# Patient Record
Sex: Female | Born: 1985 | Race: White | Hispanic: Yes | Marital: Single | Smoking: Never smoker
Health system: Southern US, Community
[De-identification: ages and names within clinical notes are randomized; demographics above are authoritative.]

## PROBLEM LIST (undated history)

## (undated) DIAGNOSIS — F419 Anxiety disorder, unspecified: Secondary | ICD-10-CM

## (undated) DIAGNOSIS — Z309 Encounter for contraceptive management, unspecified: Secondary | ICD-10-CM

## (undated) DIAGNOSIS — Z8619 Personal history of other infectious and parasitic diseases: Secondary | ICD-10-CM

## (undated) HISTORY — DX: Encounter for contraceptive management, unspecified: Z30.9

## (undated) HISTORY — PX: OTHER SURGICAL HISTORY: SHX169

## (undated) HISTORY — DX: Personal history of other infectious and parasitic diseases: Z86.19

## (undated) HISTORY — DX: Anxiety disorder, unspecified: F41.9

---

## 2006-12-28 ENCOUNTER — Other Ambulatory Visit: Admission: RE | Admit: 2006-12-28 | Discharge: 2006-12-28 | Payer: Self-pay | Admitting: Obstetrics and Gynecology

## 2007-12-09 ENCOUNTER — Ambulatory Visit: Payer: Self-pay | Admitting: Gastroenterology

## 2007-12-23 ENCOUNTER — Ambulatory Visit (HOSPITAL_COMMUNITY): Admission: RE | Admit: 2007-12-23 | Discharge: 2007-12-23 | Payer: Self-pay | Admitting: Gastroenterology

## 2007-12-23 ENCOUNTER — Encounter: Payer: Self-pay | Admitting: Gastroenterology

## 2007-12-23 ENCOUNTER — Ambulatory Visit: Payer: Self-pay | Admitting: Gastroenterology

## 2008-01-03 ENCOUNTER — Other Ambulatory Visit: Admission: RE | Admit: 2008-01-03 | Discharge: 2008-01-03 | Payer: Self-pay | Admitting: Obstetrics & Gynecology

## 2009-02-13 ENCOUNTER — Other Ambulatory Visit: Admission: RE | Admit: 2009-02-13 | Discharge: 2009-02-13 | Payer: Self-pay | Admitting: Obstetrics and Gynecology

## 2010-08-20 NOTE — Op Note (Signed)
NAME:  Debbie Fernandez, Debbie Fernandez             ACCOUNT NO.:  [id number]   MEDICAL RECORD NO.:  [id number]          PATIENT TYPE:  AMB   LOCATION:  DAY                           FACILITY:  [facility]   PHYSICIAN:  Kassie Mends, M.D.      DATE OF BIRTH:  October 20, 1985   DATE OF PROCEDURE:  12/23/2007  DATE OF DISCHARGE:                               OPERATIVE REPORT   PROCEDURE:  Ileocolonoscopy with random cold forceps biopsies   INDICATION FOR EXAM:  Ms. Difonzo is a 25 year old female who  complains of abdominal pain, diarrhea, and rectal bleeding. Sometime,  she does have normal stool.  She takes ibuprofen.  She is having  financial difficulties.  Her BMI is consistent with being overweight  (29.1).   FINDINGS:  1. Normal terminal ileum, approximately 20 cm visualized.  2. Single ascending colon diverticulum.  Otherwise, no polyps, masses,      inflammatory changes, or AVMs seen.  Random biopsies obtained, via      cold forceps to evaluate for microscopic colitis.  3. Small internal hemorrhoids.  Otherwise, normal retroflexed view of      the rectum.   DIAGNOSES:  1. Single ascending colon diverticulum.  2. Small internal hemorrhoids.  3. 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   RECOMMENDATIONS:  1. She should follow high-fiber diet.  2. Will call her with results of her biopsies.  3. No aspirin, NSAIDs, or anticoagulation for 7 days.  4. Follow up in 4 weeks with Lorenza Burton regarding the abdominal      pain and diarrhea.   MEDICATIONS:  1. Demerol 75 mg IV.  2. Versed 4 mg IV.   PROCEDURE TECHNIQUE:  Physical exam was performed and informed consent  was obtained from the patient after explaining the benefits, risks, and  alternatives of the procedure.  The patient was connected to the monitor  and placed in the left lateral position.  Continuous oxygen was provided  by nasal cannula and IV medicine was administered through an  indwelling  cannula.  After administration of sedation and rectal exam, the  patient's rectum was intubated and scope was advanced under direct  visualization to the distal terminal ileum.  The scope was  removed slowly by carefully examining the color, texture, anatomy, and  integrity of mucosa on the way out. The patient was recovered in  endoscopy and discharged to home in satisfactory condition.   PATH:  Nl colon      Kassie Mends, M.D.  Electronically Signed     SM/MEDQ  D:  12/23/2007  T:  12/23/2007  Job:  [id number]

## 2010-08-20 NOTE — H&P (Signed)
NAME:  Debbie Fernandez, Debbie Fernandez             ACCOUNT NO.:  [id number]   MEDICAL RECORD NO.:  [id number]         PATIENT TYPE:  PAMB   LOCATION:  DAY                           FACILITY:  [facility]   PHYSICIAN:  Kassie Mends, M.D.      DATE OF BIRTH:  Oct 04, 1985   DATE OF ADMISSION:  DATE OF DISCHARGE:  LH                              HISTORY & PHYSICAL   CHIEF COMPLAINT:  Diarrhea/hematochezia.   HISTORY OF PRESENT ILLNESS:  Ms. Skeet is a 25 year old Hispanic  female.  She tells me that every time she eats she has significant  urgency to defecate.  She has had this problem for a couple of years  now.  She has loose stools twice per day.  Not long ago, she had a  similar episode, she ran to the bathroom, she noticed a large amount of  bright red blood in the toilet.  She tells me it looked like when she  had a period.  She complained of bilateral lower quadrant pain that is  intermittent.  Occasionally she gets constipated, but does have episodes  of diarrhea at least 2-3 times per week.  Her symptoms awaken her from  sleeping as well.  She has had 1 episode of nausea and vomiting with the  diarrhea.  She has tried antacids, which did not seem to help much.  She  does complain of heartburn and indigestion a couple of times about 3  times per week.  She does take an occasional NSAID about 600 mg of  ibuprofen once or twice a month for headaches.  She denies any fever or  chills.  Denies any anorexia.  She has lost about 5 pounds in the last  month or so.   CURRENT MEDICATIONS:  Denies any.   ALLERGIES:  No known drug allergies.   FAMILY HISTORY:  There is no known family history of colorectal  carcinoma, liver or chronic GI problems.  She has 14 siblings all of  whom are relatively healthy.  Her father is aged 41, alive and healthy.  Mother aged 55 has diabetes mellitus and cervical carcinoma.   SOCIAL HISTORY:  Ms. Pierre is single.  She lives alone.  She is  employed with [employer] full time.  She denies any tobacco or drug  use.  She consumes about 6 beers every 2 or 3 months.   REVIEW OF SYSTEMS:  See HPI.   GYN:  She is not currently sexually active and has not been in the last  9 months or so.  Her last menstrual period began December 06, 2007.  She  generally has 28 days cycles, which lasts about 3-5 days.  She denies  any menorrhagia.   PHYSICAL EXAMINATION:  VITAL SIGNS:  Weight 154 pounds, height 61  inches, temperature 99.3, blood pressure 120/80, and pulse 80.  GENERAL:  She is a well-developed, well-nourished female in no acute  distress.HEENT:  Sclerae clear, nonicteric.  Conjunctivae pink.  Oropharynx pink and moist without any lesions.NECK:  Supple without mass  or thyromegaly.CHEST:  Heart, regular rate and rhythm.  Normal S1 and  s2  without murmurs, clicks, rubs, or gallops.LUNGS:  Clear to auscultation  bilaterally.ABDOMEN:  Positive bowel sounds x4.  No bruits auscultated.  Soft, nontender, and nondistended without palpable mass or  hepatosplenomegaly.  No rebound tenderness or guarding.EXTREMITIES:  Without clubbing or edema.RECTAL:  No external lesions visualized.  Good  sphincter tone.  No internal masses palpated.  Small amount of Hemoccult  negative light brown stool was obtained from the vault.   IMPRESSION:  Ms. Ghazarian is a 25 year old female with chronic  intermittent diarrhea and severe urgency.  More concerning is her recent  episode of large volume hematochezia, which she describes is like a  period.  There is nothing to explain her bleeding on digital rectal  exam.  I do feel we need to rule out inflammatory bowel disease in Ms.  Ardyth Harps.  It is possible this could be irritable bowel syndrome with  benign anorectal source such as hemorrhoids or fissure.  Diverticula  bleeding should remain in the differential as well.  She does have  heartburn and indigestion about 3 times per week and may benefit from  PPI as well.    PLAN:  1. Will check CBC and sed rate.  2. Begin omeprazole 20 mg daily, #31 with 2 refills.  3. Levsin 0.125 mg a.c. and h.s. up to q.i.d. p.r.n. diarrhea, #60      with 1 refill.  4. Colonoscopy with Dr. Cira Servant in the near future.  Discussed the      procedure including risks and benefits which include but ,      tenderness, benefits, which include limited to bleeding infection,      perforation, and drug reaction.  She agrees.  Planned consent will      be obtained.      Lorenza Burton, N.P.      Kassie Mends, M.D.  Electronically Signed    KJ/MEDQ  D:  12/09/2007  T:  12/10/2007  Job:  [id number]

## 2012-03-22 ENCOUNTER — Other Ambulatory Visit (HOSPITAL_COMMUNITY)
Admission: RE | Admit: 2012-03-22 | Discharge: 2012-03-22 | Disposition: A | Discharge status: Home / Self Care | Payer: BC Managed Care – PPO | Source: Ambulatory Visit | Attending: Obstetrics and Gynecology | Admitting: Obstetrics and Gynecology

## 2012-03-22 ENCOUNTER — Other Ambulatory Visit: Payer: Self-pay | Admitting: Adult Health

## 2012-03-22 DIAGNOSIS — Z113 Encounter for screening for infections with a predominantly sexual mode of transmission: Secondary | ICD-10-CM | POA: Insufficient documentation

## 2012-03-22 DIAGNOSIS — Z01419 Encounter for gynecological examination (general) (routine) without abnormal findings: Secondary | ICD-10-CM | POA: Insufficient documentation

## 2013-04-11 ENCOUNTER — Other Ambulatory Visit: Payer: Self-pay | Admitting: Adult Health

## 2013-05-04 ENCOUNTER — Encounter (INDEPENDENT_AMBULATORY_CARE_PROVIDER_SITE_OTHER): Payer: Self-pay

## 2013-05-04 ENCOUNTER — Encounter: Payer: Self-pay | Admitting: Adult Health

## 2013-05-04 ENCOUNTER — Ambulatory Visit (INDEPENDENT_AMBULATORY_CARE_PROVIDER_SITE_OTHER): Payer: 59 | Admitting: Adult Health

## 2013-05-04 VITALS — BP 120/84 | HR 76 | Ht 61.5 in | Wt 174.5 lb

## 2013-05-04 DIAGNOSIS — Z01419 Encounter for gynecological examination (general) (routine) without abnormal findings: Secondary | ICD-10-CM

## 2013-05-04 DIAGNOSIS — Z309 Encounter for contraceptive management, unspecified: Secondary | ICD-10-CM | POA: Insufficient documentation

## 2013-05-04 HISTORY — DX: Encounter for contraceptive management, unspecified: Z30.9

## 2013-05-04 NOTE — Progress Notes (Signed)
Patient ID: Debbie Fernandez, female   DOB: 09-25-1985, 28 y.o.   MRN: [id number] History of Present Illness: Debbie Fernandez is a 28 year old Hispanic female in for a physical, she had a normal pap 03/2012.   Current Medications, Allergies, Past Medical History, Past Surgical History, Family History and Social History were reviewed in [facility] electronic medical record.     Review of Systems: Patient denies any headaches, blurred vision, shortness of breath, chest pain, abdominal pain, problems with bowel movements, or urination.She does complain of weight gain,tired at times and sex burns and is uncomfortable.No joint pain or mood swings. She says she notices vaginal odor at times.    Physical Exam:BP 120/84  Pulse 76  Ht 5' 1.5" (1.562 m)  Wt 174 lb 8 oz (79.153 kg)  BMI 32.44 kg/m2  LMP 04/08/2013 General:  Well developed, well nourished, no acute distress Skin:  Warm and dry Neck:  Midline trachea, normal thyroid Lungs; Clear to auscultation bilaterally Breast:  No dominant palpable mass, retraction, or nipple discharge Cardiovascular: Regular rate and rhythm Abdomen:  Soft, non tender, no hepatosplenomegaly Pelvic:  External genitalia is normal in appearance.  The vagina is normal in appearance, no discharge or odor. The cervix is smooth.  Uterus is felt to be normal size, shape, and contour.  No   adnexal masses or tenderness noted. Extremities:  No swelling or varicosities noted Psych:  No mood changes, alert and cooperative, seems happy Discussed using lubrication and increased fore play with sex.   Impression: Yearly gyn exam no pap contraceptive management    Plan: Physical and pap in 1 year Continue tri sprintec Return in 1 week for fasting labs,CBC,CMP,TSH and lipids

## 2013-05-04 NOTE — Patient Instructions (Signed)
Try lubricate and increase foreplay with sex Return in 1 year for pap

## 2013-05-05 ENCOUNTER — Telehealth: Payer: Self-pay | Admitting: Adult Health

## 2013-05-05 MED ORDER — NORETHIN ACE-ETH ESTRAD-FE 1-20 MG-MCG PO TABS: 1.0000 | ORAL_TABLET | Freq: Every day | ORAL | Status: DC

## 2013-05-05 NOTE — Telephone Encounter (Signed)
Pt wants a OC that is free will rx junel fe

## 2013-05-11 ENCOUNTER — Other Ambulatory Visit: Payer: 59

## 2013-09-14 ENCOUNTER — Telehealth: Payer: Self-pay | Admitting: *Deleted

## 2013-09-14 NOTE — Telephone Encounter (Signed)
Has various complaints, make appt

## 2013-09-14 NOTE — Telephone Encounter (Signed)
Pt states that she has been spotting and the last two days have been really bad. PT states that Monday her legs were achy then it moved in to her back and her whole body ached and now is in the back of her neck and head. Pt states that she is filling up the pantiliners, put one on at 5 this morning and was full by 10. In April she had an episode where she bled through her clothes at work and that has never happened. May she had spotting in between her period, and then this month she has had the spotting for a few days with the last 2 days being the worst. Pt also states that she has been having cramping as well. Pt states that she has been really depressed and stressed and doesn't know if that has anything to do with it. Pt is on BCP and was wanting to know if it could have anything to do with these issues.

## 2013-09-21 ENCOUNTER — Ambulatory Visit: Payer: 59 | Admitting: Adult Health

## 2013-12-28 ENCOUNTER — Ambulatory Visit (INDEPENDENT_AMBULATORY_CARE_PROVIDER_SITE_OTHER): Payer: 59 | Admitting: Adult Health

## 2013-12-28 ENCOUNTER — Encounter: Payer: Self-pay | Admitting: Adult Health

## 2013-12-28 DIAGNOSIS — Z3201 Encounter for pregnancy test, result positive: Secondary | ICD-10-CM

## 2013-12-28 LAB — POCT URINE PREGNANCY: Preg Test, Ur: POSITIVE

## 2013-12-28 NOTE — Progress Notes (Signed)
Pt here for pregnancy test. Positive result. Pt reports cramping, and breast tenderness, but no spotting. Advised cramping and spotting can be normal in early pregnancy, that's just everything getting settled into place. Advised when she notices either, push fluids, take it easy, and call office. Pt voiced understanding. JSY

## 2014-01-04 ENCOUNTER — Telehealth: Payer: Self-pay | Admitting: Adult Health

## 2014-01-04 NOTE — Telephone Encounter (Signed)
Spoke with pt. Pt wants to start birth control. Advised would need an appt to discuss options. Pt voiced understanding. Call transferred to front desk for appt. JSY

## 2014-01-10 ENCOUNTER — Ambulatory Visit: Payer: 59 | Admitting: Adult Health

## 2014-01-10 ENCOUNTER — Telehealth: Payer: Self-pay | Admitting: Adult Health

## 2014-01-10 NOTE — Telephone Encounter (Signed)
Has appt 10/19 will discuss birth control then, Took abortion pill last Saturday bleedig

## 2014-01-16 ENCOUNTER — Other Ambulatory Visit: Payer: 59

## 2014-01-23 ENCOUNTER — Encounter: Payer: Self-pay | Admitting: Adult Health

## 2014-01-23 ENCOUNTER — Ambulatory Visit (INDEPENDENT_AMBULATORY_CARE_PROVIDER_SITE_OTHER): Payer: 59 | Admitting: Adult Health

## 2014-01-23 VITALS — BP 100/60 | Ht 60.0 in | Wt 152.5 lb

## 2014-01-23 DIAGNOSIS — Z3202 Encounter for pregnancy test, result negative: Secondary | ICD-10-CM

## 2014-01-23 DIAGNOSIS — Z30018 Encounter for initial prescription of other contraceptives: Secondary | ICD-10-CM

## 2014-01-23 DIAGNOSIS — Z113 Encounter for screening for infections with a predominantly sexual mode of transmission: Secondary | ICD-10-CM

## 2014-01-23 LAB — POCT URINE PREGNANCY: Preg Test, Ur: NEGATIVE

## 2014-01-23 MED ORDER — ETONOGESTREL-ETHINYL ESTRADIOL 0.12-0.015 MG/24HR VA RING: VAGINAL_RING | VAGINAL | Status: DC

## 2014-01-23 NOTE — Progress Notes (Signed)
Subjective:     Patient ID: Debbie Fernandez, female   DOB: 03-Sep-1985, 28 y.o.   MRN: [id number]  HPI Debbie Fernandez is a 28 year old Hispanic female in to discuss birth control and get STD testing.  Review of Systems See HPI Reviewed past medical,surgical, social and family history. Reviewed medications and allergies.     Objective:   Physical Exam BP 100/60  Ht 5' (1.524 m)  Wt 152 lb 8 oz (69.174 kg)  BMI 29.78 kg/m2  LMP 09/28/2015UPT negative, had elective Ab by pill in September, no period since has had follow up there, wants birth control and not the pill had weight gain, will try the nuva ring, after discussing different kinds and side effects.    Assessment:     Contraceptive management STD screening    Plan:    Use condoms at least for 1 month GC/CHL sent Rx nuva ring x 1 year, 1 sample given today Review handout on nuva ring Return in 3 months for pap and physical

## 2014-01-23 NOTE — Patient Instructions (Signed)

## 2014-01-24 LAB — GC/CHLAMYDIA PROBE AMP
CT Probe RNA: NEGATIVE
GC Probe RNA: NEGATIVE

## 2014-01-27 ENCOUNTER — Ambulatory Visit: Payer: 59 | Admitting: Adult Health

## 2014-02-06 ENCOUNTER — Encounter: Payer: Self-pay | Admitting: Adult Health

## 2014-05-08 ENCOUNTER — Other Ambulatory Visit: Payer: 59 | Admitting: Adult Health

## 2014-05-08 ENCOUNTER — Other Ambulatory Visit: Payer: Self-pay | Admitting: Adult Health

## 2014-05-23 ENCOUNTER — Other Ambulatory Visit: Payer: Self-pay | Admitting: Adult Health

## 2014-05-23 ENCOUNTER — Other Ambulatory Visit: Payer: Self-pay | Admitting: *Deleted

## 2014-05-23 MED ORDER — ETONOGESTREL-ETHINYL ESTRADIOL 0.12-0.015 MG/24HR VA RING: VAGINAL_RING | VAGINAL | Status: DC

## 2014-05-24 ENCOUNTER — Other Ambulatory Visit (HOSPITAL_COMMUNITY)
Admission: RE | Admit: 2014-05-24 | Discharge: 2014-05-24 | Disposition: A | Discharge status: Home / Self Care | Payer: BLUE CROSS/BLUE SHIELD | Source: Ambulatory Visit | Attending: Obstetrics and Gynecology | Admitting: Obstetrics and Gynecology

## 2014-05-24 ENCOUNTER — Encounter: Payer: Self-pay | Admitting: Adult Health

## 2014-05-24 ENCOUNTER — Ambulatory Visit (INDEPENDENT_AMBULATORY_CARE_PROVIDER_SITE_OTHER): Payer: BLUE CROSS/BLUE SHIELD | Admitting: Adult Health

## 2014-05-24 VITALS — BP 110/70 | HR 78 | Ht 60.5 in | Wt 150.0 lb

## 2014-05-24 DIAGNOSIS — Z3040 Encounter for surveillance of contraceptives, unspecified: Secondary | ICD-10-CM

## 2014-05-24 DIAGNOSIS — Z01419 Encounter for gynecological examination (general) (routine) without abnormal findings: Secondary | ICD-10-CM | POA: Diagnosis not present

## 2014-05-24 MED ORDER — ETONOGESTREL-ETHINYL ESTRADIOL 0.12-0.015 MG/24HR VA RING: VAGINAL_RING | VAGINAL | Status: DC

## 2014-05-24 NOTE — Progress Notes (Signed)
Patient ID: Debbie Fernandez, female   DOB: 8/1Johney Frame1/1987, 29 y.o.   MRN: [id number] History of Present Illness: Debbie Fernandez is a 29 year old Hispanic female in for well woman gyn exam and pap.She is complaining of not starting period til puts ring in and it lasts 4 days, has been leaving in 25 days and out for 3 days.Has had some acne but better since using cleanser.She finishing up grad school.   Current Medications, Allergies, Past Medical History, Past Surgical History, Family History and Social History were reviewed in [facility] electronic medical record.     Review of Systems: Patient denies any headaches, hearing loss, fatigue, blurred vision, shortness of breath, chest pain, abdominal pain, problems with bowel movements, urination, or intercourse. No joint pain or mood swings, may be moody at times, but not enough for meds.See HPI for positives.    Physical Exam:BP 110/70 mmHg  Pulse 78  Ht 5' 0.5" (1.537 m)  Wt 150 lb (68.04 kg)  BMI 28.80 kg/m2  LMP 05/03/2014 General:  Well developed, well nourished, no acute distress Skin:  Warm and dry Neck:  Midline trachea, normal thyroid, good ROM, no lymphadenopathy Lungs; Clear to auscultation bilaterally Breast:  No dominant palpable mass, retraction, or nipple discharge Cardiovascular: Regular rate and rhythm Abdomen:  Soft, non tender, no hepatosplenomegaly Pelvic:  External genitalia is normal in appearance, no lesions.  The vagina is normal in appearance, with good color, moisture and rugae, nuva ring in place. Urethra has no lesions or masses. The cervix is smooth and pap was performed.  Uterus is felt to be normal size, shape, and contour.  No adnexal masses or tenderness noted.Bladder is non tender, no masses felt. Extremities/musculoskeletal:  No swelling or varicosities noted, no clubbing or cyanosis Psych:  No mood changes, alert and cooperative,seems happy   Impression: Well woman gyn exam with pap Contraceptive  management   Plan: Refilled nuva ring x 1 year, put in for 21 days and leave out x 7 Physical in 1 year Use condoms  Call prn

## 2014-05-24 NOTE — Patient Instructions (Addendum)
Physical in 1 year Put ring in for 21  Days and out 7 days to see if periods better

## 2014-05-26 LAB — CYTOLOGY - PAP

## 2015-03-13 ENCOUNTER — Other Ambulatory Visit: Payer: Self-pay | Admitting: Adult Health

## 2015-03-14 ENCOUNTER — Telehealth: Payer: Self-pay | Admitting: *Deleted

## 2015-03-14 ENCOUNTER — Telehealth: Payer: Self-pay | Admitting: Adult Health

## 2015-03-14 NOTE — Telephone Encounter (Signed)
Pt's insurance has denied NuvaRing. JAG advised to give 2 sample rings. Lot # [id number] exp 05/2017. Pt to pick up today. JSY

## 2015-05-30 ENCOUNTER — Encounter: Payer: Self-pay | Admitting: Adult Health

## 2015-05-30 ENCOUNTER — Ambulatory Visit (INDEPENDENT_AMBULATORY_CARE_PROVIDER_SITE_OTHER): Payer: 59 | Admitting: Adult Health

## 2015-05-30 VITALS — BP 102/82 | HR 62 | Ht 60.5 in | Wt 165.0 lb

## 2015-05-30 DIAGNOSIS — Z3040 Encounter for surveillance of contraceptives, unspecified: Secondary | ICD-10-CM

## 2015-05-30 DIAGNOSIS — Z01419 Encounter for gynecological examination (general) (routine) without abnormal findings: Secondary | ICD-10-CM | POA: Diagnosis not present

## 2015-05-30 MED ORDER — ETONOGESTREL-ETHINYL ESTRADIOL 0.12-0.015 MG/24HR VA RING: VAGINAL_RING | VAGINAL | Status: DC

## 2015-05-30 NOTE — Progress Notes (Signed)
Patient ID: Debbie Fernandez, female   DOB: [birth year], 30 y.o.   MRN: [id number] History of Present Illness: Debbie Fernandez is a 30 year old white female in for well woman gyn exam, had normal pap 05/24/14 and wants to talk birth control, is on nuva ring and likes it but insurance may not cover it any more.   Current Medications, Allergies, Past Medical History, Past Surgical History, Family History and Social History were reviewed in [facility] record.     Review of Systems:  Patient denies any headaches, hearing loss, fatigue, blurred vision, shortness of breath, chest pain, abdominal pain, problems with bowel movements, urination, or intercourse. No joint pain or mood swings.   Physical Exam:BP 102/82 mmHg  Pulse 62  Ht 5' 0.5" (1.537 m)  Wt 165 lb (74.844 kg)  BMI 31.68 kg/m2  LMP 05/14/2015 General:  Well developed, well nourished, no acute distress Skin:  Warm and dry Neck:  Midline trachea, normal thyroid, good ROM, no lymphadenopathy Lungs; Clear to auscultation bilaterally Breast:  No dominant palpable mass, retraction, or nipple discharge Cardiovascular: Regular rate and rhythm Abdomen:  Soft, non tender, no hepatosplenomegaly Pelvic:  External genitalia is normal in appearance, no lesions.  The vagina is normal in appearance.Nuva ring in place. Urethra has no lesions or masses. The cervix is smooth.  Uterus is felt to be normal size, shape, and contour.  No adnexal masses or tenderness noted.Bladder is non tender, no masses felt. Extremities/musculoskeletal:  No swelling or varicosities noted, no clubbing or cyanosis Psych:  No mood changes, alert and cooperative,seems happy   Impression: Well woman gyn exam no pap Contraceptive management  Plan: Refilled nuva ring for 1 year and 3 samples given lot [id number] exp 2/19 Pap and physical in 1 year Call insurance company and discuss, given mirena IUD handout, as it is covered and she may change to it

## 2015-05-30 NOTE — Patient Instructions (Signed)
Pap and physical in 1 year  

## 2016-01-28 ENCOUNTER — Telehealth: Payer: Self-pay | Admitting: Adult Health

## 2016-01-28 NOTE — Telephone Encounter (Signed)
Spoke with pt. Pt is interested in an IUD vs. Nexplanon. Pt was last seen in Feb. I advised she would need to be seen to discuss options. Pt agreed and call was transferred to front desk. JSY

## 2016-02-06 ENCOUNTER — Encounter: Payer: Self-pay | Admitting: Adult Health

## 2016-02-06 ENCOUNTER — Ambulatory Visit (INDEPENDENT_AMBULATORY_CARE_PROVIDER_SITE_OTHER): Payer: 59 | Admitting: Adult Health

## 2016-02-06 VITALS — BP 100/80 | HR 76 | Ht 60.0 in | Wt 163.0 lb

## 2016-02-06 DIAGNOSIS — Z3009 Encounter for other general counseling and advice on contraception: Secondary | ICD-10-CM | POA: Diagnosis not present

## 2016-02-06 NOTE — Patient Instructions (Signed)
No sex Return in 1 week for labs and nexplanon insertion

## 2016-02-06 NOTE — Progress Notes (Signed)
Subjective:     Patient ID: Debbie Fernandez, female   DOB: Apr 05, 1986, 30 y.o.   MRN: [id number]  HPI Debbie Fernandez is 30 year old Hispanic female in to discuss nexplanon.She was on nuva ring and liked it but insurance will not cover and she declines going to get Family planning medicaid.  Review of Systems To discuss nexplanon Reviewed past medical,surgical, social and family history. Reviewed medications and allergies.     Objective:   Physical Exam BP 100/80   Pulse 76   Ht 5' (1.524 m)   Wt 163 lb (73.9 kg)   LMP 01/24/2016   BMI 31.83 kg/m PHQ 9 score 12 but pt says she is not depressed,declines meds and says she is not suicidal.Discussed nexplanon and she wants to get it.   She wants labs. Last sex labor day.  Assessment:     1. Encounter for education about contraceptive use       Plan:     No sex Return in 1 week for stat QHCG and HIV,RPR,Check CBC,CMP,TSH and lipids,A1c and vitamin D, and then see me in pm for nexplanon insertion Review handout on nexplanon, will check insurance now

## 2016-02-11 ENCOUNTER — Ambulatory Visit: Payer: 59 | Admitting: Adult Health

## 2016-02-14 ENCOUNTER — Other Ambulatory Visit: Payer: 59

## 2016-02-20 ENCOUNTER — Encounter: Payer: Self-pay | Admitting: Adult Health

## 2016-02-20 ENCOUNTER — Other Ambulatory Visit: Payer: 59

## 2016-02-20 ENCOUNTER — Other Ambulatory Visit: Payer: Self-pay | Admitting: Adult Health

## 2016-02-20 ENCOUNTER — Ambulatory Visit (INDEPENDENT_AMBULATORY_CARE_PROVIDER_SITE_OTHER): Payer: 59 | Admitting: Adult Health

## 2016-02-20 VITALS — BP 100/70 | HR 70 | Ht 60.0 in | Wt 166.0 lb

## 2016-02-20 DIAGNOSIS — Z3049 Encounter for surveillance of other contraceptives: Secondary | ICD-10-CM | POA: Diagnosis not present

## 2016-02-20 DIAGNOSIS — Z113 Encounter for screening for infections with a predominantly sexual mode of transmission: Secondary | ICD-10-CM

## 2016-02-20 DIAGNOSIS — Z13220 Encounter for screening for lipoid disorders: Secondary | ICD-10-CM

## 2016-02-20 DIAGNOSIS — Z308 Encounter for other contraceptive management: Secondary | ICD-10-CM

## 2016-02-20 DIAGNOSIS — Z1329 Encounter for screening for other suspected endocrine disorder: Secondary | ICD-10-CM

## 2016-02-20 DIAGNOSIS — Z131 Encounter for screening for diabetes mellitus: Secondary | ICD-10-CM

## 2016-02-20 DIAGNOSIS — Z30017 Encounter for initial prescription of implantable subdermal contraceptive: Secondary | ICD-10-CM | POA: Diagnosis not present

## 2016-02-20 DIAGNOSIS — Z01419 Encounter for gynecological examination (general) (routine) without abnormal findings: Secondary | ICD-10-CM

## 2016-02-20 LAB — BETA HCG QUANT (REF LAB): hCG Quant: <1 m[IU]/mL

## 2016-02-20 NOTE — Progress Notes (Signed)
Subjective:     Patient ID: Debbie Fernandez, female   DOB: 09/12/1985, 30 y.o.   MRN: [id number]  HPI Debbie Fernandez is a 30 year old Hispanic female in for nexplanon insertion.   Review of Systems For Nexplanon insertion  Reviewed past medical,surgical, social and family history. Reviewed medications and allergies.  Objective:   Physical Exam  BP 100/70 (BP Location: Left Arm, Patient Position: Sitting, Cuff Size: Normal)   Pulse 70   Ht 5' (1.524 m)   Wt 166 lb (75.3 kg)   LMP 01/24/2016 (Exact Date)   BMI 32.42 kg/m  QHCG <1 this am. Consent signed, time out called. Left arm cleansed with betadine, and injected with 1.5 cc 2% lidocaine and waited til numb. Nexplanon easily inserted and steri strips applied.Rod easily palpated by provider and pt. Pressure dressing applied.   PHQ 2 score 0.     Assessment:     1. Nexplanon insertion   lot # [id number] exp 04/2018    Plan:     Use condoms x 2 weeks, keep clean and dry x 24 hours, no heavy lifting, keep steri strips on x 72 hours, Keep pressure dressing on x 24 hours. Follow up prn problems.

## 2016-02-20 NOTE — Patient Instructions (Signed)
Use condoms x 2 weeks, keep clean and dry x 24 hours, no heavy lifting, keep steri strips on x 72 hours, Keep pressure dressing on x 24 hours. Follow up prn problems.  

## 2016-02-21 LAB — COMPREHENSIVE METABOLIC PANEL
ALT: 18 IU/L (ref 0–32)
AST: 16 IU/L (ref 0–40)
Albumin/Globulin Ratio: 1.7 (ref 1.2–2.2)
Albumin: 4.4 g/dL (ref 3.5–5.5)
Alkaline Phosphatase: 68 IU/L (ref 39–117)
BUN/Creatinine Ratio: 18 (ref 9–23)
BUN: 11 mg/dL (ref 6–20)
Bilirubin Total: 0.6 mg/dL (ref 0.0–1.2)
CO2: 23 mmol/L (ref 18–29)
Calcium: 9.3 mg/dL (ref 8.7–10.2)
Chloride: 104 mmol/L (ref 96–106)
Creatinine, Ser: 0.62 mg/dL (ref 0.57–1.00)
GFR calc Af Amer: 140 mL/min/{1.73_m2} (ref >59)
GFR calc non Af Amer: 121 mL/min/{1.73_m2} (ref >59)
Globulin, Total: 2.6 g/dL (ref 1.5–4.5)
Glucose: 87 mg/dL (ref 65–99)
Potassium: 4.7 mmol/L (ref 3.5–5.2)
Sodium: 140 mmol/L (ref 134–144)
Total Protein: 7 g/dL (ref 6.0–8.5)

## 2016-02-21 LAB — CBC
Hematocrit: 38.4 % (ref 34.0–46.6)
Hemoglobin: 13 g/dL (ref 11.1–15.9)
MCH: 30.2 pg (ref 26.6–33.0)
MCHC: 33.9 g/dL (ref 31.5–35.7)
MCV: 89 fL (ref 79–97)
Platelets: 359 10*3/uL (ref 150–379)
RBC: 4.31 x10E6/uL (ref 3.77–5.28)
RDW: 13.3 % (ref 12.3–15.4)
WBC: 7.9 10*3/uL (ref 3.4–10.8)

## 2016-02-21 LAB — HEMOGLOBIN A1C
Est. average glucose Bld gHb Est-mCnc: 100 mg/dL
Hgb A1c MFr Bld: 5.1 % (ref 4.8–5.6)

## 2016-02-21 LAB — LIPID PANEL
Chol/HDL Ratio: 3.1 ratio units (ref 0.0–4.4)
Cholesterol, Total: 160 mg/dL (ref 100–199)
HDL: 51 mg/dL (ref >39)
LDL Calculated: 94 mg/dL (ref 0–99)
Triglycerides: 73 mg/dL (ref 0–149)
VLDL Cholesterol Cal: 15 mg/dL (ref 5–40)

## 2016-02-21 LAB — TSH: TSH: 2.72 u[IU]/mL (ref 0.450–4.500)

## 2016-02-21 LAB — HIV ANTIBODY (ROUTINE TESTING W REFLEX): HIV Screen 4th Generation wRfx: NONREACTIVE

## 2016-02-21 LAB — RPR: RPR Ser Ql: NONREACTIVE

## 2017-02-13 ENCOUNTER — Encounter: Payer: Self-pay | Admitting: Adult Health

## 2017-02-13 ENCOUNTER — Ambulatory Visit (INDEPENDENT_AMBULATORY_CARE_PROVIDER_SITE_OTHER): Payer: BLUE CROSS/BLUE SHIELD | Admitting: Adult Health

## 2017-02-13 VITALS — BP 100/60 | HR 98 | Ht 60.0 in | Wt 176.0 lb

## 2017-02-13 DIAGNOSIS — Z3046 Encounter for surveillance of implantable subdermal contraceptive: Secondary | ICD-10-CM | POA: Insufficient documentation

## 2017-02-13 DIAGNOSIS — F329 Major depressive disorder, single episode, unspecified: Secondary | ICD-10-CM

## 2017-02-13 DIAGNOSIS — Z3202 Encounter for pregnancy test, result negative: Secondary | ICD-10-CM | POA: Diagnosis not present

## 2017-02-13 DIAGNOSIS — F32A Depression, unspecified: Secondary | ICD-10-CM | POA: Insufficient documentation

## 2017-02-13 DIAGNOSIS — Z30015 Encounter for initial prescription of vaginal ring hormonal contraceptive: Secondary | ICD-10-CM | POA: Diagnosis not present

## 2017-02-13 LAB — POCT URINE PREGNANCY: Preg Test, Ur: NEGATIVE

## 2017-02-13 MED ORDER — ETONOGESTREL-ETHINYL ESTRADIOL 0.12-0.015 MG/24HR VA RING: VAGINAL_RING | VAGINAL | 12 refills | Status: DC

## 2017-02-13 NOTE — Patient Instructions (Signed)
Use condoms x 4 weeks, keep clean and dry x 24 hours, no heavy lifting, keep steri strips on x 72 hours, Keep pressure dressing on x 24 hours. Follow up prn problems. Put nuva ring today Pap and physical in February

## 2017-02-13 NOTE — Progress Notes (Signed)
Subjective:     Patient ID: Debbie Fernandez, female   DOB: 12/30/1985, 31 y.o.   MRN: [id number]  HPI Debbie Fernandez is a 31 year old Hispanic female in for nexplanon removal.  Review of Systems +weight gain  Irregular periods  Reviewed past medical,surgical, social and family history. Reviewed medications and allergies.     Objective:   Physical Exam BP 100/60 (BP Location: Left Arm, Patient Position: Sitting, Cuff Size: Small)   Pulse 98   Ht 5' (1.524 m)   Wt 176 lb (79.8 kg)   LMP 02/04/2017   BMI 34.37 kg/m UPT negative.Consent signed. Left arm cleansed with betadine, and injected with 1.5 cc 2% lidocaine and waited til numb.Under sterile technique a #11 blade was used to make small vertical incision, and a curved forceps was used to easily remove rod. Steri strips applied. Pressure dressing applied.   PHQ 9 score 9, denies being suicidal or homicidal  and declines meds.   Assessment:     1. Nexplanon removal   2. Pregnancy examination or test, negative result   3. Encounter for initial prescription of vaginal ring hormonal contraceptive   4. Depression, unspecified depression type       Plan:     Use condoms x 4  weeks, keep clean and dry x 24 hours, no heavy lifting, keep steri strips on x 72 hours, Keep pressure dressing on x 24 hours. Follow up prn problems. Put nuva ring in today Meds ordered this encounter  Medications  . etonogestrel-ethinyl estradiol (NUVARING) 0.12-0.015 MG/24HR vaginal ring    Sig: Insert vaginally and leave in place for 3 consecutive weeks, then remove for 1 week.    Dispense:  1 each    Refill:  12    Order Specific Question:   Supervising Provider    Answer:   Lazaro ArmsEURE, LUTHER H [[id number]]  Pap and physical in February

## 2017-05-06 ENCOUNTER — Ambulatory Visit: Payer: BLUE CROSS/BLUE SHIELD | Admitting: Psychology

## 2017-05-15 ENCOUNTER — Other Ambulatory Visit: Payer: BLUE CROSS/BLUE SHIELD | Admitting: Adult Health

## 2017-05-22 ENCOUNTER — Ambulatory Visit (INDEPENDENT_AMBULATORY_CARE_PROVIDER_SITE_OTHER): Payer: BLUE CROSS/BLUE SHIELD | Admitting: Psychology

## 2017-05-22 DIAGNOSIS — F411 Generalized anxiety disorder: Secondary | ICD-10-CM | POA: Diagnosis not present

## 2017-06-11 ENCOUNTER — Other Ambulatory Visit: Payer: BLUE CROSS/BLUE SHIELD | Admitting: Adult Health

## 2017-06-12 ENCOUNTER — Ambulatory Visit (INDEPENDENT_AMBULATORY_CARE_PROVIDER_SITE_OTHER): Payer: BLUE CROSS/BLUE SHIELD | Admitting: Psychology

## 2017-06-12 DIAGNOSIS — F411 Generalized anxiety disorder: Secondary | ICD-10-CM

## 2017-06-23 ENCOUNTER — Other Ambulatory Visit (HOSPITAL_COMMUNITY)
Admission: RE | Admit: 2017-06-23 | Discharge: 2017-06-23 | Disposition: A | Discharge status: Home / Self Care | Payer: BLUE CROSS/BLUE SHIELD | Source: Ambulatory Visit | Attending: Adult Health | Admitting: Adult Health

## 2017-06-23 ENCOUNTER — Encounter: Payer: Self-pay | Admitting: Adult Health

## 2017-06-23 ENCOUNTER — Ambulatory Visit (INDEPENDENT_AMBULATORY_CARE_PROVIDER_SITE_OTHER): Payer: BLUE CROSS/BLUE SHIELD | Admitting: Adult Health

## 2017-06-23 VITALS — BP 122/88 | HR 71 | Ht 60.75 in | Wt 176.0 lb

## 2017-06-23 DIAGNOSIS — Z01411 Encounter for gynecological examination (general) (routine) with abnormal findings: Secondary | ICD-10-CM

## 2017-06-23 DIAGNOSIS — F329 Major depressive disorder, single episode, unspecified: Secondary | ICD-10-CM | POA: Diagnosis not present

## 2017-06-23 DIAGNOSIS — F32A Depression, unspecified: Secondary | ICD-10-CM

## 2017-06-23 DIAGNOSIS — Z3044 Encounter for surveillance of vaginal ring hormonal contraceptive device: Secondary | ICD-10-CM | POA: Diagnosis not present

## 2017-06-23 DIAGNOSIS — Z01419 Encounter for gynecological examination (general) (routine) without abnormal findings: Secondary | ICD-10-CM | POA: Diagnosis not present

## 2017-06-23 MED ORDER — ETONOGESTREL-ETHINYL ESTRADIOL 0.12-0.015 MG/24HR VA RING: VAGINAL_RING | VAGINAL | 12 refills | Status: DC

## 2017-06-23 NOTE — Progress Notes (Signed)
Patient ID: Debbie Fernandez, female   DOB: [birth year], 32 y.o.   MRN: [id number] History of Present Illness: Gunnar Fusiaula is a 32 year old Hispanic female, in for a well woman gyn exam and pap.    Current Medications, Allergies, Past Medical History, Past Surgical History, Family History and Social History were reviewed in [facility] electronic medical record.     Review of Systems:  Patient denies any headaches, hearing loss, fatigue, blurred vision, shortness of breath, chest pain, abdominal pain, problems with bowel movements, urination, or intercourse. No joint pain or mood swings.Occasional heartburn, declines meds   Physical Exam:BP 122/88 (BP Location: Left Arm, Patient Position: Sitting, Cuff Size: Normal)   Pulse 71   Ht 5' 0.75" (1.543 m)   Wt 176 lb (79.8 kg)   LMP 06/12/2017 (Approximate)   BMI 33.53 kg/m  General:  Well developed, well nourished, no acute distress Skin:  Warm and dry Neck:  Midline trachea, normal thyroid, good ROM, no lymphadenopathy Lungs; Clear to auscultation bilaterally Breast:  No dominant palpable mass, retraction, or nipple discharge Cardiovascular: Regular rate and rhythm Abdomen:  Soft, no hepatosplenomegaly,tender in epigastric area, and has navel pierced  Pelvic:  External genitalia is normal in appearance, no lesions.  The vagina is normal in appearance. Nuva ring in vagina. Urethra has no lesions or masses. The cervix is smooth, pap with GC/CHL and HPV performed.  Uterus is felt to be normal size, shape, and contour.  No adnexal masses or tenderness noted.Bladder is non tender, no masses felt. Extremities/musculoskeletal:  No swelling or varicosities noted, no clubbing or cyanosis Psych:  No mood changes, alert and cooperative,seems happy PHQ 9 score 11, denies being suicidal is seeing therapist.  Impression: 1. Encounter for gynecological examination with Papanicolaou smear of cervix   2. Depression, unspecified depression type   3.  Encounter for surveillance of vaginal ring hormonal contraceptive device       Plan:  Meds ordered this encounter  Medications  . etonogestrel-ethinyl estradiol (NUVARING) 0.12-0.015 MG/24HR vaginal ring    Sig: Insert vaginally and leave in place for 3 consecutive weeks, then remove for 1 week.    Dispense:  1 each    Refill:  12    Order Specific Question:   Supervising Provider    Answer:   Lazaro ArmsEURE, LUTHER H [[id number]]  Physical in 1 year Pap in 3 if normal

## 2017-06-25 LAB — CYTOLOGY - PAP
Adequacy: ABSENT
Chlamydia: NEGATIVE
Diagnosis: NEGATIVE
HPV: NOT DETECTED
Neisseria Gonorrhea: NEGATIVE

## 2017-07-03 ENCOUNTER — Ambulatory Visit (INDEPENDENT_AMBULATORY_CARE_PROVIDER_SITE_OTHER): Payer: BLUE CROSS/BLUE SHIELD | Admitting: Psychology

## 2017-07-03 DIAGNOSIS — F411 Generalized anxiety disorder: Secondary | ICD-10-CM | POA: Diagnosis not present

## 2017-07-16 ENCOUNTER — Ambulatory Visit (INDEPENDENT_AMBULATORY_CARE_PROVIDER_SITE_OTHER): Payer: BLUE CROSS/BLUE SHIELD | Admitting: Psychology

## 2017-07-16 DIAGNOSIS — F411 Generalized anxiety disorder: Secondary | ICD-10-CM

## 2017-07-30 ENCOUNTER — Ambulatory Visit (INDEPENDENT_AMBULATORY_CARE_PROVIDER_SITE_OTHER): Payer: BLUE CROSS/BLUE SHIELD | Admitting: Psychology

## 2017-07-30 DIAGNOSIS — F411 Generalized anxiety disorder: Secondary | ICD-10-CM | POA: Diagnosis not present

## 2017-08-13 ENCOUNTER — Ambulatory Visit: Payer: BLUE CROSS/BLUE SHIELD | Admitting: Psychology

## 2017-08-13 DIAGNOSIS — F411 Generalized anxiety disorder: Secondary | ICD-10-CM | POA: Diagnosis not present

## 2017-08-24 ENCOUNTER — Ambulatory Visit: Payer: BLUE CROSS/BLUE SHIELD | Admitting: Psychology

## 2017-08-24 DIAGNOSIS — F411 Generalized anxiety disorder: Secondary | ICD-10-CM

## 2017-08-27 ENCOUNTER — Ambulatory Visit: Payer: BLUE CROSS/BLUE SHIELD | Admitting: Psychology

## 2017-09-02 ENCOUNTER — Other Ambulatory Visit: Payer: Self-pay | Admitting: Physician Assistant

## 2017-09-02 ENCOUNTER — Ambulatory Visit
Admission: RE | Admit: 2017-09-02 | Discharge: 2017-09-02 | Disposition: A | Discharge status: Home / Self Care | Payer: BLUE CROSS/BLUE SHIELD | Source: Ambulatory Visit | Attending: Physician Assistant | Admitting: Physician Assistant

## 2017-09-02 DIAGNOSIS — R52 Pain, unspecified: Secondary | ICD-10-CM

## 2017-09-10 ENCOUNTER — Ambulatory Visit: Payer: BLUE CROSS/BLUE SHIELD | Admitting: Psychology

## 2017-09-10 DIAGNOSIS — F411 Generalized anxiety disorder: Secondary | ICD-10-CM

## 2017-10-01 ENCOUNTER — Ambulatory Visit (INDEPENDENT_AMBULATORY_CARE_PROVIDER_SITE_OTHER): Payer: BLUE CROSS/BLUE SHIELD | Admitting: Psychology

## 2017-10-01 DIAGNOSIS — F411 Generalized anxiety disorder: Secondary | ICD-10-CM

## 2017-10-16 ENCOUNTER — Ambulatory Visit: Payer: BLUE CROSS/BLUE SHIELD | Admitting: Psychology

## 2017-10-16 DIAGNOSIS — F411 Generalized anxiety disorder: Secondary | ICD-10-CM

## 2017-10-27 ENCOUNTER — Ambulatory Visit: Payer: BLUE CROSS/BLUE SHIELD | Admitting: Psychology

## 2017-10-27 DIAGNOSIS — F411 Generalized anxiety disorder: Secondary | ICD-10-CM | POA: Diagnosis not present

## 2017-11-09 ENCOUNTER — Ambulatory Visit: Payer: BLUE CROSS/BLUE SHIELD | Admitting: Psychology

## 2017-11-09 DIAGNOSIS — F411 Generalized anxiety disorder: Secondary | ICD-10-CM

## 2017-11-25 ENCOUNTER — Ambulatory Visit: Payer: BLUE CROSS/BLUE SHIELD | Admitting: Psychology

## 2017-11-25 DIAGNOSIS — F411 Generalized anxiety disorder: Secondary | ICD-10-CM | POA: Diagnosis not present

## 2017-12-08 ENCOUNTER — Ambulatory Visit: Payer: BLUE CROSS/BLUE SHIELD | Admitting: Psychology

## 2017-12-08 DIAGNOSIS — F411 Generalized anxiety disorder: Secondary | ICD-10-CM

## 2017-12-23 ENCOUNTER — Ambulatory Visit: Payer: BLUE CROSS/BLUE SHIELD | Admitting: Psychology

## 2017-12-23 DIAGNOSIS — F411 Generalized anxiety disorder: Secondary | ICD-10-CM

## 2018-01-07 ENCOUNTER — Ambulatory Visit: Payer: BLUE CROSS/BLUE SHIELD | Admitting: Psychology

## 2018-01-19 ENCOUNTER — Ambulatory Visit: Payer: BLUE CROSS/BLUE SHIELD | Admitting: Psychology

## 2018-01-19 DIAGNOSIS — F411 Generalized anxiety disorder: Secondary | ICD-10-CM | POA: Diagnosis not present

## 2018-01-20 ENCOUNTER — Ambulatory Visit: Payer: BLUE CROSS/BLUE SHIELD | Admitting: Psychology

## 2018-02-02 ENCOUNTER — Ambulatory Visit: Payer: BLUE CROSS/BLUE SHIELD | Admitting: Psychology

## 2018-02-02 DIAGNOSIS — F411 Generalized anxiety disorder: Secondary | ICD-10-CM | POA: Diagnosis not present

## 2018-02-04 ENCOUNTER — Ambulatory Visit: Payer: BLUE CROSS/BLUE SHIELD | Admitting: Psychology

## 2018-02-16 ENCOUNTER — Ambulatory Visit: Payer: BLUE CROSS/BLUE SHIELD | Admitting: Psychology

## 2018-02-16 DIAGNOSIS — F411 Generalized anxiety disorder: Secondary | ICD-10-CM | POA: Diagnosis not present

## 2018-03-03 ENCOUNTER — Ambulatory Visit: Payer: BLUE CROSS/BLUE SHIELD | Admitting: Psychology

## 2018-03-16 ENCOUNTER — Ambulatory Visit: Payer: BLUE CROSS/BLUE SHIELD | Admitting: Psychology

## 2018-03-16 DIAGNOSIS — F411 Generalized anxiety disorder: Secondary | ICD-10-CM

## 2018-03-29 ENCOUNTER — Ambulatory Visit: Payer: BLUE CROSS/BLUE SHIELD | Admitting: Psychology

## 2018-03-29 DIAGNOSIS — F411 Generalized anxiety disorder: Secondary | ICD-10-CM

## 2018-04-15 ENCOUNTER — Ambulatory Visit (INDEPENDENT_AMBULATORY_CARE_PROVIDER_SITE_OTHER): Payer: BLUE CROSS/BLUE SHIELD | Admitting: Psychology

## 2018-04-15 DIAGNOSIS — F411 Generalized anxiety disorder: Secondary | ICD-10-CM

## 2018-04-28 ENCOUNTER — Ambulatory Visit: Payer: BLUE CROSS/BLUE SHIELD | Admitting: Psychology

## 2018-04-30 ENCOUNTER — Ambulatory Visit: Payer: BLUE CROSS/BLUE SHIELD | Admitting: Psychology

## 2018-04-30 DIAGNOSIS — F411 Generalized anxiety disorder: Secondary | ICD-10-CM

## 2018-05-13 ENCOUNTER — Ambulatory Visit: Payer: BLUE CROSS/BLUE SHIELD | Admitting: Psychology

## 2018-05-13 DIAGNOSIS — F411 Generalized anxiety disorder: Secondary | ICD-10-CM | POA: Diagnosis not present

## 2018-05-25 ENCOUNTER — Ambulatory Visit (INDEPENDENT_AMBULATORY_CARE_PROVIDER_SITE_OTHER): Payer: BLUE CROSS/BLUE SHIELD | Admitting: Psychology

## 2018-05-25 DIAGNOSIS — F411 Generalized anxiety disorder: Secondary | ICD-10-CM

## 2018-05-27 ENCOUNTER — Ambulatory Visit: Payer: BLUE CROSS/BLUE SHIELD | Admitting: Psychology

## 2018-06-10 ENCOUNTER — Ambulatory Visit: Payer: BLUE CROSS/BLUE SHIELD | Admitting: Psychology

## 2018-06-10 DIAGNOSIS — F411 Generalized anxiety disorder: Secondary | ICD-10-CM | POA: Diagnosis not present

## 2018-06-24 ENCOUNTER — Ambulatory Visit: Payer: BLUE CROSS/BLUE SHIELD | Admitting: Psychology

## 2018-06-24 ENCOUNTER — Other Ambulatory Visit: Payer: Self-pay

## 2018-06-24 DIAGNOSIS — F411 Generalized anxiety disorder: Secondary | ICD-10-CM | POA: Diagnosis not present

## 2018-07-08 ENCOUNTER — Ambulatory Visit: Payer: BLUE CROSS/BLUE SHIELD | Admitting: Psychology

## 2018-07-18 ENCOUNTER — Other Ambulatory Visit: Payer: Self-pay | Admitting: Adult Health

## 2018-07-23 ENCOUNTER — Ambulatory Visit (INDEPENDENT_AMBULATORY_CARE_PROVIDER_SITE_OTHER): Payer: BLUE CROSS/BLUE SHIELD | Admitting: Psychology

## 2018-07-23 DIAGNOSIS — F411 Generalized anxiety disorder: Secondary | ICD-10-CM

## 2018-08-06 ENCOUNTER — Ambulatory Visit (INDEPENDENT_AMBULATORY_CARE_PROVIDER_SITE_OTHER): Payer: BLUE CROSS/BLUE SHIELD | Admitting: Psychology

## 2018-08-06 DIAGNOSIS — F411 Generalized anxiety disorder: Secondary | ICD-10-CM

## 2018-08-20 ENCOUNTER — Ambulatory Visit (INDEPENDENT_AMBULATORY_CARE_PROVIDER_SITE_OTHER): Payer: BLUE CROSS/BLUE SHIELD | Admitting: Psychology

## 2018-08-20 DIAGNOSIS — F411 Generalized anxiety disorder: Secondary | ICD-10-CM | POA: Diagnosis not present

## 2018-09-03 ENCOUNTER — Ambulatory Visit (INDEPENDENT_AMBULATORY_CARE_PROVIDER_SITE_OTHER): Payer: BLUE CROSS/BLUE SHIELD | Admitting: Psychology

## 2018-09-03 DIAGNOSIS — F411 Generalized anxiety disorder: Secondary | ICD-10-CM | POA: Diagnosis not present

## 2018-09-17 ENCOUNTER — Ambulatory Visit (INDEPENDENT_AMBULATORY_CARE_PROVIDER_SITE_OTHER): Payer: BC Managed Care – PPO | Admitting: Psychology

## 2018-09-17 DIAGNOSIS — F411 Generalized anxiety disorder: Secondary | ICD-10-CM | POA: Diagnosis not present

## 2018-10-01 ENCOUNTER — Ambulatory Visit (INDEPENDENT_AMBULATORY_CARE_PROVIDER_SITE_OTHER): Payer: BC Managed Care – PPO | Admitting: Psychology

## 2018-10-01 DIAGNOSIS — F411 Generalized anxiety disorder: Secondary | ICD-10-CM

## 2018-10-15 ENCOUNTER — Ambulatory Visit (INDEPENDENT_AMBULATORY_CARE_PROVIDER_SITE_OTHER): Payer: BC Managed Care – PPO | Admitting: Psychology

## 2018-10-15 DIAGNOSIS — F411 Generalized anxiety disorder: Secondary | ICD-10-CM | POA: Diagnosis not present

## 2018-10-28 ENCOUNTER — Ambulatory Visit (INDEPENDENT_AMBULATORY_CARE_PROVIDER_SITE_OTHER): Payer: BC Managed Care – PPO | Admitting: Psychology

## 2018-10-28 DIAGNOSIS — F411 Generalized anxiety disorder: Secondary | ICD-10-CM | POA: Diagnosis not present

## 2018-11-12 ENCOUNTER — Ambulatory Visit: Payer: Self-pay | Admitting: Psychology

## 2018-11-25 ENCOUNTER — Ambulatory Visit: Payer: Self-pay | Admitting: Psychology

## 2018-12-17 ENCOUNTER — Ambulatory Visit: Payer: Medicaid Other | Admitting: Psychology

## 2019-01-04 ENCOUNTER — Ambulatory Visit: Payer: Medicaid Other | Admitting: Psychology

## 2019-01-24 ENCOUNTER — Ambulatory Visit: Payer: Medicaid Other | Admitting: Psychology

## 2019-02-25 ENCOUNTER — Ambulatory Visit: Payer: Medicaid Other | Admitting: Psychology

## 2019-06-23 ENCOUNTER — Ambulatory Visit: Payer: Self-pay | Attending: Family

## 2019-06-23 DIAGNOSIS — Z23 Encounter for immunization: Secondary | ICD-10-CM

## 2019-06-23 NOTE — Progress Notes (Signed)
   Covid-19 Vaccination Clinic  Name:  Debbie Fernandez    MRN: [id number] DOB: [birth year]  06/23/2019  [name] was observed post Covid-19 immunization for 15 minutes without incident. She was provided with Vaccine Information Sheet and instruction to access the V-Safe system.   [name] was instructed to call 911 with any severe reactions post vaccine: Marland Kitchen Difficulty breathing  . Swelling of face and throat  . A fast heartbeat  . A bad rash all over body  . Dizziness and weakness   Immunizations Administered    Name Date Dose VIS Date Route   Moderna COVID-19 Vaccine 06/23/2019 11:59 AM 0.5 mL 03/08/2019 Intramuscular   Manufacturer: Moderna   Lot: [id number]   NDC: 10932-355-73

## 2019-07-26 ENCOUNTER — Ambulatory Visit: Payer: Medicaid Other | Attending: Family

## 2019-07-26 DIAGNOSIS — Z23 Encounter for immunization: Secondary | ICD-10-CM

## 2019-07-26 NOTE — Progress Notes (Signed)
   Covid-19 Vaccination Clinic  Name:  GENORA ARP    MRN: [id number] DOB: 1985/07/29  07/26/2019  Ms. Reen was observed post Covid-19 immunization for 15 minutes without incident. She was provided with Vaccine Information Sheet and instruction to access the V-Safe system.   Ms. Mckenney was instructed to call 911 with any severe reactions post vaccine: Marland Kitchen Difficulty breathing  . Swelling of face and throat  . A fast heartbeat  . A bad rash all over body  . Dizziness and weakness   Immunizations Administered    Name Date Dose VIS Date Route   Moderna COVID-19 Vaccine 07/26/2019 10:07 AM 0.5 mL 03/2019 Intramuscular   Manufacturer: Moderna   Lot: [id number]   NDC: 92119-417-40

## 2020-01-09 ENCOUNTER — Ambulatory Visit (INDEPENDENT_AMBULATORY_CARE_PROVIDER_SITE_OTHER): Payer: BC Managed Care – PPO | Admitting: Psychology

## 2020-01-09 DIAGNOSIS — F411 Generalized anxiety disorder: Secondary | ICD-10-CM

## 2020-01-11 LAB — OB RESULTS CONSOLE ABO/RH: RH Type: POSITIVE

## 2020-01-11 LAB — OB RESULTS CONSOLE HEPATITIS B SURFACE ANTIGEN: Hepatitis B Surface Ag: NEGATIVE

## 2020-01-11 LAB — OB RESULTS CONSOLE RPR: RPR: NONREACTIVE

## 2020-01-11 LAB — OB RESULTS CONSOLE GC/CHLAMYDIA
Chlamydia: NEGATIVE
Gonorrhea: NEGATIVE

## 2020-01-11 LAB — OB RESULTS CONSOLE ANTIBODY SCREEN: Antibody Screen: NEGATIVE

## 2020-01-11 LAB — OB RESULTS CONSOLE HIV ANTIBODY (ROUTINE TESTING): HIV: NONREACTIVE

## 2020-01-11 LAB — OB RESULTS CONSOLE RUBELLA ANTIBODY, IGM: Rubella: IMMUNE

## 2020-02-01 ENCOUNTER — Ambulatory Visit (INDEPENDENT_AMBULATORY_CARE_PROVIDER_SITE_OTHER): Payer: BC Managed Care – PPO | Admitting: Psychology

## 2020-02-01 DIAGNOSIS — F411 Generalized anxiety disorder: Secondary | ICD-10-CM | POA: Diagnosis not present

## 2020-02-13 ENCOUNTER — Ambulatory Visit (INDEPENDENT_AMBULATORY_CARE_PROVIDER_SITE_OTHER): Payer: BC Managed Care – PPO | Admitting: Psychology

## 2020-02-13 DIAGNOSIS — F411 Generalized anxiety disorder: Secondary | ICD-10-CM | POA: Diagnosis not present

## 2020-03-14 ENCOUNTER — Ambulatory Visit (INDEPENDENT_AMBULATORY_CARE_PROVIDER_SITE_OTHER): Payer: BC Managed Care – PPO | Admitting: Psychology

## 2020-03-14 DIAGNOSIS — F411 Generalized anxiety disorder: Secondary | ICD-10-CM | POA: Diagnosis not present

## 2020-03-27 ENCOUNTER — Ambulatory Visit (INDEPENDENT_AMBULATORY_CARE_PROVIDER_SITE_OTHER): Payer: BC Managed Care – PPO | Admitting: Psychology

## 2020-03-27 DIAGNOSIS — F411 Generalized anxiety disorder: Secondary | ICD-10-CM | POA: Diagnosis not present

## 2020-04-12 ENCOUNTER — Encounter: Payer: Self-pay | Admitting: Obstetrics & Gynecology

## 2020-04-13 ENCOUNTER — Ambulatory Visit (INDEPENDENT_AMBULATORY_CARE_PROVIDER_SITE_OTHER): Payer: BC Managed Care – PPO | Admitting: Psychology

## 2020-04-13 DIAGNOSIS — F411 Generalized anxiety disorder: Secondary | ICD-10-CM

## 2020-04-17 ENCOUNTER — Other Ambulatory Visit: Payer: Self-pay

## 2020-04-17 ENCOUNTER — Other Ambulatory Visit: Payer: Self-pay | Admitting: Obstetrics and Gynecology

## 2020-04-17 DIAGNOSIS — Z363 Encounter for antenatal screening for malformations: Secondary | ICD-10-CM

## 2020-04-27 ENCOUNTER — Ambulatory Visit (INDEPENDENT_AMBULATORY_CARE_PROVIDER_SITE_OTHER): Payer: BC Managed Care – PPO | Admitting: Psychology

## 2020-04-27 DIAGNOSIS — F411 Generalized anxiety disorder: Secondary | ICD-10-CM

## 2020-04-30 ENCOUNTER — Other Ambulatory Visit: Payer: Self-pay | Admitting: *Deleted

## 2020-04-30 ENCOUNTER — Ambulatory Visit (HOSPITAL_BASED_OUTPATIENT_CLINIC_OR_DEPARTMENT_OTHER): Payer: Medicaid Other | Admitting: Obstetrics

## 2020-04-30 ENCOUNTER — Ambulatory Visit: Payer: Medicaid Other | Admitting: *Deleted

## 2020-04-30 ENCOUNTER — Ambulatory Visit: Payer: Medicaid Other | Attending: Obstetrics and Gynecology

## 2020-04-30 ENCOUNTER — Encounter: Payer: Self-pay | Admitting: *Deleted

## 2020-04-30 ENCOUNTER — Other Ambulatory Visit: Payer: Self-pay

## 2020-04-30 VITALS — BP 130/67 | HR 74 | Ht 60.5 in

## 2020-04-30 DIAGNOSIS — Z3A22 22 weeks gestation of pregnancy: Secondary | ICD-10-CM | POA: Diagnosis present

## 2020-04-30 DIAGNOSIS — O43199 Other malformation of placenta, unspecified trimester: Secondary | ICD-10-CM

## 2020-04-30 DIAGNOSIS — O4422 Partial placenta previa NOS or without hemorrhage, second trimester: Secondary | ICD-10-CM | POA: Diagnosis present

## 2020-04-30 DIAGNOSIS — O3662X0 Maternal care for excessive fetal growth, second trimester, not applicable or unspecified: Secondary | ICD-10-CM | POA: Diagnosis not present

## 2020-04-30 DIAGNOSIS — O43192 Other malformation of placenta, second trimester: Secondary | ICD-10-CM | POA: Diagnosis not present

## 2020-04-30 DIAGNOSIS — Z363 Encounter for antenatal screening for malformations: Secondary | ICD-10-CM | POA: Insufficient documentation

## 2020-04-30 NOTE — Progress Notes (Signed)
MFM Consult Note  Debbie Fernandez was seen for a detailed fetal anatomy scan and consultation due to a possible velamentous cord insertion versus a marginal placental cord insertion noted on a recent exam in your office.   She denies any problems in her current pregnancy and denies any significant past medical history.  She had a cell free DNA test earlier in her pregnancy which indicated a low risk for trisomy 42, 31, and 13.  She did not want the fetal gender revealed.  She was informed that the fetal growth and amniotic fluid level were appropriate for her gestational age.   There were no obvious fetal anomalies noted on today's ultrasound exam.  The patient was informed that anomalies may be missed due to technical limitations. If the fetus is in a suboptimal position or maternal habitus is increased, visualization of the fetus in the maternal uterus may be impaired.  On today's exam, a marginal placental cord insertion was noted.  The umbilical cord appears to be inserted into the lateral edge of the anterior placenta near the fundus of her uterus.  The umbilical cord measures about 2 cm away from the edge of the placenta.    She was advised that a marginal placental cord insertion is most likely a normal variant.  The small association of a marginal cord insertion with fetal growth issues later in her pregnancy was discussed.  Due to this indication, we will continue to follow her with monthly growth ultrasounds throughout her pregnancy.  The patient was reassured that the fetal growth actually measured large for her gestational age today.  There were no signs of a vasa previa noted today.  A follow-up exam was scheduled in 5 weeks to assess the fetal growth.  Total time spent in consultation: 35 minutes.

## 2020-05-17 ENCOUNTER — Ambulatory Visit (INDEPENDENT_AMBULATORY_CARE_PROVIDER_SITE_OTHER): Payer: BC Managed Care – PPO | Admitting: Psychology

## 2020-05-17 DIAGNOSIS — F411 Generalized anxiety disorder: Secondary | ICD-10-CM

## 2020-06-04 ENCOUNTER — Encounter: Payer: Self-pay | Admitting: *Deleted

## 2020-06-04 ENCOUNTER — Ambulatory Visit: Payer: Medicaid Other | Admitting: *Deleted

## 2020-06-04 ENCOUNTER — Other Ambulatory Visit: Payer: Self-pay

## 2020-06-04 ENCOUNTER — Ambulatory Visit: Payer: Medicaid Other | Attending: Obstetrics

## 2020-06-04 VITALS — BP 120/63 | HR 72

## 2020-06-04 DIAGNOSIS — E669 Obesity, unspecified: Secondary | ICD-10-CM | POA: Diagnosis not present

## 2020-06-04 DIAGNOSIS — O99212 Obesity complicating pregnancy, second trimester: Secondary | ICD-10-CM | POA: Diagnosis not present

## 2020-06-04 DIAGNOSIS — O43192 Other malformation of placenta, second trimester: Secondary | ICD-10-CM | POA: Diagnosis not present

## 2020-06-04 DIAGNOSIS — Z3A27 27 weeks gestation of pregnancy: Secondary | ICD-10-CM | POA: Diagnosis not present

## 2020-06-04 DIAGNOSIS — O43199 Other malformation of placenta, unspecified trimester: Secondary | ICD-10-CM | POA: Diagnosis present

## 2020-06-04 DIAGNOSIS — Z6835 Body mass index (BMI) 35.0-35.9, adult: Secondary | ICD-10-CM | POA: Diagnosis present

## 2020-06-05 ENCOUNTER — Other Ambulatory Visit: Payer: Self-pay | Admitting: Obstetrics

## 2020-06-05 DIAGNOSIS — Z3A32 32 weeks gestation of pregnancy: Secondary | ICD-10-CM

## 2020-06-05 DIAGNOSIS — O99213 Obesity complicating pregnancy, third trimester: Secondary | ICD-10-CM

## 2020-06-05 DIAGNOSIS — O43193 Other malformation of placenta, third trimester: Secondary | ICD-10-CM

## 2020-06-07 ENCOUNTER — Ambulatory Visit (INDEPENDENT_AMBULATORY_CARE_PROVIDER_SITE_OTHER): Payer: BC Managed Care – PPO | Admitting: Psychology

## 2020-06-07 DIAGNOSIS — F411 Generalized anxiety disorder: Secondary | ICD-10-CM | POA: Diagnosis not present

## 2020-06-21 ENCOUNTER — Ambulatory Visit (INDEPENDENT_AMBULATORY_CARE_PROVIDER_SITE_OTHER): Payer: BC Managed Care – PPO | Admitting: Psychology

## 2020-06-21 DIAGNOSIS — F411 Generalized anxiety disorder: Secondary | ICD-10-CM | POA: Diagnosis not present

## 2020-07-09 ENCOUNTER — Encounter: Payer: Self-pay | Admitting: *Deleted

## 2020-07-09 ENCOUNTER — Other Ambulatory Visit: Payer: Self-pay

## 2020-07-09 ENCOUNTER — Other Ambulatory Visit: Payer: Self-pay | Admitting: *Deleted

## 2020-07-09 ENCOUNTER — Ambulatory Visit: Payer: Medicaid Other | Admitting: *Deleted

## 2020-07-09 ENCOUNTER — Ambulatory Visit: Payer: Medicaid Other | Attending: Obstetrics

## 2020-07-09 VITALS — BP 133/72 | HR 80

## 2020-07-09 DIAGNOSIS — E669 Obesity, unspecified: Secondary | ICD-10-CM | POA: Insufficient documentation

## 2020-07-09 DIAGNOSIS — Z362 Encounter for other antenatal screening follow-up: Secondary | ICD-10-CM | POA: Diagnosis not present

## 2020-07-09 DIAGNOSIS — O3663X0 Maternal care for excessive fetal growth, third trimester, not applicable or unspecified: Secondary | ICD-10-CM

## 2020-07-09 DIAGNOSIS — Z3A32 32 weeks gestation of pregnancy: Secondary | ICD-10-CM | POA: Diagnosis not present

## 2020-07-09 DIAGNOSIS — O43193 Other malformation of placenta, third trimester: Secondary | ICD-10-CM | POA: Diagnosis not present

## 2020-07-09 DIAGNOSIS — O43199 Other malformation of placenta, unspecified trimester: Secondary | ICD-10-CM

## 2020-07-09 DIAGNOSIS — O99213 Obesity complicating pregnancy, third trimester: Secondary | ICD-10-CM | POA: Diagnosis not present

## 2020-07-13 ENCOUNTER — Ambulatory Visit (INDEPENDENT_AMBULATORY_CARE_PROVIDER_SITE_OTHER): Payer: BC Managed Care – PPO | Admitting: Psychology

## 2020-07-13 DIAGNOSIS — F411 Generalized anxiety disorder: Secondary | ICD-10-CM

## 2020-07-25 ENCOUNTER — Ambulatory Visit (INDEPENDENT_AMBULATORY_CARE_PROVIDER_SITE_OTHER): Payer: BC Managed Care – PPO | Admitting: Psychology

## 2020-07-25 DIAGNOSIS — F411 Generalized anxiety disorder: Secondary | ICD-10-CM | POA: Diagnosis not present

## 2020-08-08 ENCOUNTER — Ambulatory Visit (INDEPENDENT_AMBULATORY_CARE_PROVIDER_SITE_OTHER): Payer: BC Managed Care – PPO | Admitting: Psychology

## 2020-08-08 DIAGNOSIS — F411 Generalized anxiety disorder: Secondary | ICD-10-CM | POA: Diagnosis not present

## 2020-08-13 ENCOUNTER — Ambulatory Visit (HOSPITAL_BASED_OUTPATIENT_CLINIC_OR_DEPARTMENT_OTHER): Payer: Medicaid Other | Admitting: *Deleted

## 2020-08-13 ENCOUNTER — Encounter: Payer: Self-pay | Admitting: *Deleted

## 2020-08-13 ENCOUNTER — Ambulatory Visit: Payer: Medicaid Other | Attending: Obstetrics

## 2020-08-13 ENCOUNTER — Other Ambulatory Visit: Payer: Self-pay | Admitting: Obstetrics

## 2020-08-13 ENCOUNTER — Other Ambulatory Visit: Payer: Self-pay

## 2020-08-13 VITALS — BP 134/74 | HR 71

## 2020-08-13 DIAGNOSIS — O3663X3 Maternal care for excessive fetal growth, third trimester, fetus 3: Secondary | ICD-10-CM | POA: Diagnosis not present

## 2020-08-13 DIAGNOSIS — O09513 Supervision of elderly primigravida, third trimester: Secondary | ICD-10-CM | POA: Diagnosis not present

## 2020-08-13 DIAGNOSIS — O99213 Obesity complicating pregnancy, third trimester: Secondary | ICD-10-CM | POA: Diagnosis not present

## 2020-08-13 DIAGNOSIS — O3663X0 Maternal care for excessive fetal growth, third trimester, not applicable or unspecified: Secondary | ICD-10-CM | POA: Diagnosis not present

## 2020-08-13 DIAGNOSIS — Z3A37 37 weeks gestation of pregnancy: Secondary | ICD-10-CM | POA: Insufficient documentation

## 2020-08-13 DIAGNOSIS — O43193 Other malformation of placenta, third trimester: Secondary | ICD-10-CM | POA: Diagnosis not present

## 2020-08-13 DIAGNOSIS — E669 Obesity, unspecified: Secondary | ICD-10-CM

## 2020-08-13 NOTE — Progress Notes (Signed)
MFM Note  Debbie Fernandez was seen for a follow up growth scan due to a large for gestational age fetus noted during her prior exams.  She has screened negative for gestational diabetes in her current pregnancy.  She denies any problems since her last exam.  She was informed that the fetal growth continues to measure large for her gestational age (greater than 99th percentile, EFW 4690 g, 10 pounds 5 ounces).  Borderline polyhydramnios was noted with a total AFI of 23.4 cm.  The inaccuracies of the ultrasound in the estimation of fetal weight at her current gestational age was discussed.  Based on current ACOG recommendations, in someone without diabetes, a cesarean delivery would be recommended should the estimated fetal weight to be 5000 g or greater.  There is a high likelihood that the estimated weight will be over 5000 g by the time she reaches 39 weeks.  The increased risk of shoulder dystocia and the potential neonatal morbidity should shoulder dystocia be encountered during a vaginal delivery was discussed.  As the patient stated that she will most likely have only 1 or 2 children, she was advised that to avoid a shoulder dystocia based on the macrosomic EFW obtained today, a cesarean delivery may be considered at around 39 weeks (at the end of next week).  The patient will discuss this option with you during her next prenatal visit tomorrow.  A total of 20 minutes was spent counseling and coordinating the care for this patient.  Greater than 50% of the time was spent in direct face-to-face contact.

## 2020-08-22 ENCOUNTER — Encounter (HOSPITAL_COMMUNITY): Payer: Self-pay

## 2020-08-22 ENCOUNTER — Other Ambulatory Visit: Payer: Self-pay | Admitting: Obstetrics & Gynecology

## 2020-08-22 ENCOUNTER — Ambulatory Visit (INDEPENDENT_AMBULATORY_CARE_PROVIDER_SITE_OTHER): Payer: BC Managed Care – PPO | Admitting: Psychology

## 2020-08-22 DIAGNOSIS — F411 Generalized anxiety disorder: Secondary | ICD-10-CM | POA: Diagnosis not present

## 2020-08-22 NOTE — Patient Instructions (Signed)
Debbie Fernandez  08/22/2020   Your procedure is scheduled on:  08/28/2020  Arrive at 0800 at Entrance C on [address] at [facility]  and [employer]. You are invited to use the FREE valet parking or use the Visitor's parking deck.  Pick up the phone at the desk and dial [phone].  Call this number if you have problems the morning of surgery: [phone]  Remember:   Do not eat food:(After Midnight) Desps de medianoche.  Do not drink clear liquids: (After Midnight) Desps de medianoche.  Take these medicines the morning of surgery with A SIP OF WATER:  May take pepcid   Do not wear jewelry, make-up or nail polish.  Do not wear lotions, powders, or perfumes. Do not wear deodorant.  Do not shave 48 hours prior to surgery.  Do not bring valuables to the hospital.  [facility] is not   responsible for any belongings or valuables brought to the hospital.  Contacts, dentures or bridgework may not be worn into surgery.  Leave suitcase in the car. After surgery it may be brought to your room.  For patients admitted to the hospital, checkout time is 11:00 AM the day of              discharge.      Please read over the following fact sheets that you were given:     Preparing for Surgery

## 2020-08-27 ENCOUNTER — Encounter (HOSPITAL_COMMUNITY)
Admission: RE | Admit: 2020-08-27 | Discharge: 2020-08-27 | Disposition: A | Discharge status: Home / Self Care | Payer: Medicaid Other | Source: Ambulatory Visit | Attending: Obstetrics & Gynecology | Admitting: Obstetrics & Gynecology

## 2020-08-27 ENCOUNTER — Other Ambulatory Visit (HOSPITAL_COMMUNITY)
Admission: RE | Admit: 2020-08-27 | Discharge: 2020-08-27 | Disposition: A | Discharge status: Home / Self Care | Payer: Medicaid Other | Source: Ambulatory Visit | Attending: Obstetrics & Gynecology | Admitting: Obstetrics & Gynecology

## 2020-08-27 ENCOUNTER — Other Ambulatory Visit: Payer: Self-pay

## 2020-08-27 DIAGNOSIS — Z01812 Encounter for preprocedural laboratory examination: Secondary | ICD-10-CM | POA: Insufficient documentation

## 2020-08-27 DIAGNOSIS — Z20822 Contact with and (suspected) exposure to covid-19: Secondary | ICD-10-CM | POA: Insufficient documentation

## 2020-08-27 LAB — CBC
HCT: 34.6 % — ABNORMAL LOW (ref 36.0–46.0)
Hemoglobin: 10.7 g/dL — ABNORMAL LOW (ref 12.0–15.0)
MCH: 27.7 pg (ref 26.0–34.0)
MCHC: 30.9 g/dL (ref 30.0–36.0)
MCV: 89.6 fL (ref 80.0–100.0)
Platelets: 291 10*3/uL (ref 150–400)
RBC: 3.86 MIL/uL — ABNORMAL LOW (ref 3.87–5.11)
RDW: 14.2 % (ref 11.5–15.5)
WBC: 8.8 10*3/uL (ref 4.0–10.5)
nRBC: 0.2 % (ref 0.0–0.2)

## 2020-08-27 LAB — BASIC METABOLIC PANEL
Anion gap: 8 (ref 5–15)
BUN: 7 mg/dL (ref 6–20)
CO2: 21 mmol/L — ABNORMAL LOW (ref 22–32)
Calcium: 8.5 mg/dL — ABNORMAL LOW (ref 8.9–10.3)
Chloride: 106 mmol/L (ref 98–111)
Creatinine, Ser: 0.65 mg/dL (ref 0.44–1.00)
GFR, Estimated: >60 mL/min (ref >60)
Glucose, Bld: 98 mg/dL (ref 70–99)
Potassium: 4.1 mmol/L (ref 3.5–5.1)
Sodium: 135 mmol/L (ref 135–145)

## 2020-08-27 LAB — SARS CORONAVIRUS 2 (TAT 6-24 HRS): SARS Coronavirus 2: NEGATIVE

## 2020-08-27 LAB — TYPE AND SCREEN
ABO/RH(D): O POS
Antibody Screen: NEGATIVE

## 2020-08-28 ENCOUNTER — Inpatient Hospital Stay (HOSPITAL_COMMUNITY): Payer: Medicaid Other | Admitting: Anesthesiology

## 2020-08-28 ENCOUNTER — Encounter (HOSPITAL_COMMUNITY): Payer: Self-pay | Admitting: Obstetrics & Gynecology

## 2020-08-28 ENCOUNTER — Inpatient Hospital Stay (HOSPITAL_COMMUNITY)
Admission: AD | Admit: 2020-08-28 | Discharge: 2020-08-31 | DRG: 787 | Disposition: A | Discharge status: Home / Self Care | Payer: Medicaid Other | Attending: Obstetrics & Gynecology | Admitting: Obstetrics & Gynecology

## 2020-08-28 ENCOUNTER — Encounter (HOSPITAL_COMMUNITY)
Admission: AD | Disposition: A | Discharge status: Home / Self Care | Payer: Self-pay | Source: Home / Self Care | Attending: Obstetrics & Gynecology

## 2020-08-28 DIAGNOSIS — O9081 Anemia of the puerperium: Secondary | ICD-10-CM | POA: Diagnosis not present

## 2020-08-28 DIAGNOSIS — O1494 Unspecified pre-eclampsia, complicating childbirth: Secondary | ICD-10-CM | POA: Diagnosis present

## 2020-08-28 DIAGNOSIS — O3663X0 Maternal care for excessive fetal growth, third trimester, not applicable or unspecified: Secondary | ICD-10-CM | POA: Diagnosis present

## 2020-08-28 DIAGNOSIS — O1490 Unspecified pre-eclampsia, unspecified trimester: Secondary | ICD-10-CM | POA: Diagnosis not present

## 2020-08-28 DIAGNOSIS — D62 Acute posthemorrhagic anemia: Secondary | ICD-10-CM | POA: Diagnosis not present

## 2020-08-28 DIAGNOSIS — Z98891 History of uterine scar from previous surgery: Secondary | ICD-10-CM

## 2020-08-28 DIAGNOSIS — Z3A39 39 weeks gestation of pregnancy: Secondary | ICD-10-CM

## 2020-08-28 DIAGNOSIS — O99214 Obesity complicating childbirth: Secondary | ICD-10-CM | POA: Diagnosis present

## 2020-08-28 DIAGNOSIS — Z20822 Contact with and (suspected) exposure to covid-19: Secondary | ICD-10-CM | POA: Diagnosis present

## 2020-08-28 LAB — COMPREHENSIVE METABOLIC PANEL
ALT: 20 U/L (ref 0–44)
AST: 30 U/L (ref 15–41)
Albumin: 2.3 g/dL — ABNORMAL LOW (ref 3.5–5.0)
Alkaline Phosphatase: 87 U/L (ref 38–126)
Anion gap: 8 (ref 5–15)
BUN: 6 mg/dL (ref 6–20)
CO2: 21 mmol/L — ABNORMAL LOW (ref 22–32)
Calcium: 8.1 mg/dL — ABNORMAL LOW (ref 8.9–10.3)
Chloride: 105 mmol/L (ref 98–111)
Creatinine, Ser: 0.67 mg/dL (ref 0.44–1.00)
GFR, Estimated: >60 mL/min (ref >60)
Glucose, Bld: 88 mg/dL (ref 70–99)
Potassium: 4.1 mmol/L (ref 3.5–5.1)
Sodium: 134 mmol/L — ABNORMAL LOW (ref 135–145)
Total Bilirubin: 0.6 mg/dL (ref 0.3–1.2)
Total Protein: 5.2 g/dL — ABNORMAL LOW (ref 6.5–8.1)

## 2020-08-28 LAB — RPR: RPR Ser Ql: NONREACTIVE

## 2020-08-28 LAB — PROTEIN / CREATININE RATIO, URINE
Creatinine, Urine: 336.28 mg/dL
Protein Creatinine Ratio: 0.38 mg/mg{Cre} — ABNORMAL HIGH (ref 0.00–0.15)
Total Protein, Urine: 127 mg/dL

## 2020-08-28 SURGERY — Surgical Case
Anesthesia: Spinal

## 2020-08-28 MED ORDER — ACETAMINOPHEN 500 MG PO TABS
1000.0000 mg | ORAL_TABLET | Freq: Four times a day (QID) | ORAL | Status: DC
  Administered 2020-08-28 – 2020-08-31 (×11): 1000 mg via ORAL
  Filled 2020-08-28 (×11): qty 2

## 2020-08-28 MED ORDER — MORPHINE SULFATE (PF) 0.5 MG/ML IJ SOLN
INTRAMUSCULAR | Status: AC
  Filled 2020-08-28: qty 10

## 2020-08-28 MED ORDER — CEFAZOLIN SODIUM-DEXTROSE 2-4 GM/100ML-% IV SOLN
INTRAVENOUS | Status: AC
  Filled 2020-08-28: qty 100

## 2020-08-28 MED ORDER — KETOROLAC TROMETHAMINE 30 MG/ML IJ SOLN
30.0000 mg | Freq: Four times a day (QID) | INTRAMUSCULAR | Status: AC
  Administered 2020-08-28 – 2020-08-29 (×4): 30 mg via INTRAVENOUS
  Filled 2020-08-28 (×3): qty 1

## 2020-08-28 MED ORDER — OXYCODONE HCL 5 MG PO TABS
5.0000 mg | ORAL_TABLET | ORAL | Status: DC | PRN
Start: 2020-08-28 — End: 2020-08-31
  Administered 2020-08-30 – 2020-08-31 (×4): 5 mg via ORAL
  Filled 2020-08-28 (×4): qty 1

## 2020-08-28 MED ORDER — FENTANYL CITRATE (PF) 100 MCG/2ML IJ SOLN
INTRAMUSCULAR | Status: AC
  Filled 2020-08-28: qty 2

## 2020-08-28 MED ORDER — COCONUT OIL OIL: 1.0000 "application " | TOPICAL_OIL | Status: DC | PRN

## 2020-08-28 MED ORDER — IBUPROFEN 600 MG PO TABS
600.0000 mg | ORAL_TABLET | Freq: Four times a day (QID) | ORAL | Status: DC
  Administered 2020-08-29 – 2020-08-31 (×8): 600 mg via ORAL
  Filled 2020-08-28 (×8): qty 1

## 2020-08-28 MED ORDER — SENNOSIDES-DOCUSATE SODIUM 8.6-50 MG PO TABS
2.0000 | ORAL_TABLET | Freq: Every day | ORAL | Status: DC
  Administered 2020-08-29 – 2020-08-31 (×3): 2 via ORAL
  Filled 2020-08-28 (×3): qty 2

## 2020-08-28 MED ORDER — ACETAMINOPHEN 10 MG/ML IV SOLN
INTRAVENOUS | Status: AC
  Filled 2020-08-28: qty 100

## 2020-08-28 MED ORDER — DEXAMETHASONE SODIUM PHOSPHATE 4 MG/ML IJ SOLN
INTRAMUSCULAR | Status: AC
  Filled 2020-08-28: qty 1

## 2020-08-28 MED ORDER — ONDANSETRON HCL 4 MG/2ML IJ SOLN
INTRAMUSCULAR | Status: AC
  Filled 2020-08-28: qty 2

## 2020-08-28 MED ORDER — ONDANSETRON HCL 4 MG/2ML IJ SOLN
INTRAMUSCULAR | Status: DC | PRN
  Administered 2020-08-28: 4 mg via INTRAVENOUS

## 2020-08-28 MED ORDER — SCOPOLAMINE 1 MG/3DAYS TD PT72
1.0000 | MEDICATED_PATCH | Freq: Once | TRANSDERMAL | Status: AC
  Administered 2020-08-28: 1.5 mg via TRANSDERMAL

## 2020-08-28 MED ORDER — PROMETHAZINE HCL 25 MG/ML IJ SOLN: 6.2500 mg | INTRAMUSCULAR | Status: DC | PRN

## 2020-08-28 MED ORDER — SODIUM CHLORIDE 0.9 % IR SOLN
Status: DC | PRN
  Administered 2020-08-28: 1

## 2020-08-28 MED ORDER — LACTATED RINGERS IV SOLN: INTRAVENOUS | Status: DC | PRN

## 2020-08-28 MED ORDER — LACTATED RINGERS IV SOLN: INTRAVENOUS | Status: DC

## 2020-08-28 MED ORDER — SODIUM CHLORIDE 0.9% FLUSH: 3.0000 mL | INTRAVENOUS | Status: DC | PRN

## 2020-08-28 MED ORDER — ZOLPIDEM TARTRATE 5 MG PO TABS: 5.0000 mg | ORAL_TABLET | Freq: Every evening | ORAL | Status: DC | PRN

## 2020-08-28 MED ORDER — BUPIVACAINE HCL 0.5 % IJ SOLN
INTRAMUSCULAR | Status: DC | PRN
  Administered 2020-08-28: 30 mL

## 2020-08-28 MED ORDER — CEFAZOLIN SODIUM-DEXTROSE 2-4 GM/100ML-% IV SOLN
2.0000 g | INTRAVENOUS | Status: AC
  Administered 2020-08-28: 2 g via INTRAVENOUS

## 2020-08-28 MED ORDER — NALOXONE HCL 0.4 MG/ML IJ SOLN: 0.4000 mg | INTRAMUSCULAR | Status: DC | PRN

## 2020-08-28 MED ORDER — CHLORHEXIDINE GLUCONATE 0.12 % MT SOLN
15.0000 mL | Freq: Once | OROMUCOSAL | Status: AC
  Administered 2020-08-28: 15 mL via OROMUCOSAL

## 2020-08-28 MED ORDER — SCOPOLAMINE 1 MG/3DAYS TD PT72
MEDICATED_PATCH | TRANSDERMAL | Status: AC
  Filled 2020-08-28: qty 1

## 2020-08-28 MED ORDER — MORPHINE SULFATE (PF) 0.5 MG/ML IJ SOLN
INTRAMUSCULAR | Status: DC | PRN
  Administered 2020-08-28: .15 mg via INTRATHECAL

## 2020-08-28 MED ORDER — POVIDONE-IODINE 10 % EX SWAB
2.0000 "application " | Freq: Once | CUTANEOUS | Status: AC
  Administered 2020-08-28: 2 via TOPICAL

## 2020-08-28 MED ORDER — OXYTOCIN-SODIUM CHLORIDE 30-0.9 UT/500ML-% IV SOLN
2.5000 [IU]/h | INTRAVENOUS | Status: AC
  Filled 2020-08-28: qty 500

## 2020-08-28 MED ORDER — WITCH HAZEL-GLYCERIN EX PADS: 1.0000 "application " | MEDICATED_PAD | CUTANEOUS | Status: DC | PRN

## 2020-08-28 MED ORDER — DIBUCAINE (PERIANAL) 1 % EX OINT: 1.0000 "application " | TOPICAL_OINTMENT | CUTANEOUS | Status: DC | PRN

## 2020-08-28 MED ORDER — DIPHENHYDRAMINE HCL 50 MG/ML IJ SOLN: 12.5000 mg | INTRAMUSCULAR | Status: DC | PRN

## 2020-08-28 MED ORDER — NALBUPHINE HCL 10 MG/ML IJ SOLN: 5.0000 mg | INTRAMUSCULAR | Status: DC | PRN

## 2020-08-28 MED ORDER — OXYCODONE HCL 5 MG PO TABS: 5.0000 mg | ORAL_TABLET | Freq: Once | ORAL | Status: DC | PRN

## 2020-08-28 MED ORDER — PHENYLEPHRINE HCL-NACL 20-0.9 MG/250ML-% IV SOLN
INTRAVENOUS | Status: DC | PRN
  Administered 2020-08-28: 60 ug/min via INTRAVENOUS

## 2020-08-28 MED ORDER — OXYCODONE HCL 5 MG/5ML PO SOLN: 5.0000 mg | Freq: Once | ORAL | Status: DC | PRN

## 2020-08-28 MED ORDER — FENTANYL CITRATE (PF) 100 MCG/2ML IJ SOLN
INTRAMUSCULAR | Status: DC | PRN
  Administered 2020-08-28: 15 ug via INTRATHECAL

## 2020-08-28 MED ORDER — ERYTHROMYCIN 5 MG/GM OP OINT
TOPICAL_OINTMENT | OPHTHALMIC | Status: AC
  Filled 2020-08-28: qty 1

## 2020-08-28 MED ORDER — SOD CITRATE-CITRIC ACID 500-334 MG/5ML PO SOLN
ORAL | Status: AC
  Filled 2020-08-28: qty 30

## 2020-08-28 MED ORDER — SIMETHICONE 80 MG PO CHEW
80.0000 mg | CHEWABLE_TABLET | ORAL | Status: DC | PRN
  Administered 2020-08-29: 80 mg via ORAL

## 2020-08-28 MED ORDER — MEPERIDINE HCL 25 MG/ML IJ SOLN: 6.2500 mg | INTRAMUSCULAR | Status: DC | PRN

## 2020-08-28 MED ORDER — VITAMIN K1 1 MG/0.5ML IJ SOLN
INTRAMUSCULAR | Status: AC
  Filled 2020-08-28: qty 0.5

## 2020-08-28 MED ORDER — NALOXONE HCL 4 MG/10ML IJ SOLN
1.0000 ug/kg/h | INTRAVENOUS | Status: DC | PRN
  Filled 2020-08-28: qty 5

## 2020-08-28 MED ORDER — FENTANYL CITRATE (PF) 100 MCG/2ML IJ SOLN: 100.0000 ug | Freq: Once | INTRAMUSCULAR | Status: DC

## 2020-08-28 MED ORDER — DIPHENHYDRAMINE HCL 25 MG PO CAPS: 25.0000 mg | ORAL_CAPSULE | Freq: Four times a day (QID) | ORAL | Status: DC | PRN

## 2020-08-28 MED ORDER — FENTANYL CITRATE (PF) 100 MCG/2ML IJ SOLN: 25.0000 ug | INTRAMUSCULAR | Status: DC | PRN

## 2020-08-28 MED ORDER — ONDANSETRON HCL 4 MG/2ML IJ SOLN: 4.0000 mg | Freq: Three times a day (TID) | INTRAMUSCULAR | Status: DC | PRN

## 2020-08-28 MED ORDER — LACTATED RINGERS IV BOLUS
1000.0000 mL | Freq: Once | INTRAVENOUS | Status: AC
  Administered 2020-08-28: 1000 mL via INTRAVENOUS

## 2020-08-28 MED ORDER — DIPHENHYDRAMINE HCL 25 MG PO CAPS
25.0000 mg | ORAL_CAPSULE | ORAL | Status: DC | PRN
Start: 2020-08-28 — End: 2020-08-31

## 2020-08-28 MED ORDER — KETOROLAC TROMETHAMINE 30 MG/ML IJ SOLN: 30.0000 mg | Freq: Four times a day (QID) | INTRAMUSCULAR | Status: DC | PRN

## 2020-08-28 MED ORDER — PHENYLEPHRINE HCL-NACL 20-0.9 MG/250ML-% IV SOLN
INTRAVENOUS | Status: AC
  Filled 2020-08-28: qty 250

## 2020-08-28 MED ORDER — NALBUPHINE HCL 10 MG/ML IJ SOLN
5.0000 mg | Freq: Once | INTRAMUSCULAR | Status: DC | PRN
Start: 2020-08-28 — End: 2020-08-31

## 2020-08-28 MED ORDER — CHLORHEXIDINE GLUCONATE 0.12 % MT SOLN
OROMUCOSAL | Status: AC
  Filled 2020-08-28: qty 15

## 2020-08-28 MED ORDER — MENTHOL 3 MG MT LOZG: 1.0000 | LOZENGE | OROMUCOSAL | Status: DC | PRN

## 2020-08-28 MED ORDER — ACETAMINOPHEN 10 MG/ML IV SOLN
INTRAVENOUS | Status: DC | PRN
  Administered 2020-08-28: 1000 mg via INTRAVENOUS

## 2020-08-28 MED ORDER — NALBUPHINE HCL 10 MG/ML IJ SOLN: 5.0000 mg | Freq: Once | INTRAMUSCULAR | Status: DC | PRN

## 2020-08-28 MED ORDER — BUPIVACAINE HCL (PF) 0.5 % IJ SOLN
INTRAMUSCULAR | Status: AC
  Filled 2020-08-28: qty 30

## 2020-08-28 MED ORDER — ORAL CARE MOUTH RINSE: 15.0000 mL | Freq: Once | OROMUCOSAL | Status: AC

## 2020-08-28 MED ORDER — FAMOTIDINE 20 MG PO TABS
20.0000 mg | ORAL_TABLET | Freq: Once | ORAL | Status: AC
  Administered 2020-08-28: 20 mg via ORAL

## 2020-08-28 MED ORDER — PRENATAL MULTIVITAMIN CH
1.0000 | ORAL_TABLET | Freq: Every day | ORAL | Status: DC
  Administered 2020-08-29 – 2020-08-30 (×2): 1 via ORAL
  Filled 2020-08-28 (×2): qty 1

## 2020-08-28 MED ORDER — KETOROLAC TROMETHAMINE 30 MG/ML IJ SOLN
INTRAMUSCULAR | Status: AC
  Filled 2020-08-28: qty 1

## 2020-08-28 MED ORDER — SOD CITRATE-CITRIC ACID 500-334 MG/5ML PO SOLN
30.0000 mL | Freq: Once | ORAL | Status: AC
  Administered 2020-08-28: 30 mL via ORAL

## 2020-08-28 MED ORDER — SIMETHICONE 80 MG PO CHEW
80.0000 mg | CHEWABLE_TABLET | Freq: Three times a day (TID) | ORAL | Status: DC
  Administered 2020-08-28 – 2020-08-31 (×7): 80 mg via ORAL
  Filled 2020-08-28 (×8): qty 1

## 2020-08-28 MED ORDER — DEXAMETHASONE SODIUM PHOSPHATE 4 MG/ML IJ SOLN
INTRAMUSCULAR | Status: DC | PRN
  Administered 2020-08-28: 4 mg via INTRAVENOUS

## 2020-08-28 MED ORDER — TETANUS-DIPHTH-ACELL PERTUSSIS 5-2.5-18.5 LF-MCG/0.5 IM SUSY: 0.5000 mL | PREFILLED_SYRINGE | Freq: Once | INTRAMUSCULAR | Status: DC

## 2020-08-28 MED ORDER — FAMOTIDINE 20 MG PO TABS
ORAL_TABLET | ORAL | Status: AC
  Filled 2020-08-28: qty 1

## 2020-08-28 MED ORDER — OXYTOCIN-SODIUM CHLORIDE 30-0.9 UT/500ML-% IV SOLN
INTRAVENOUS | Status: DC | PRN
  Administered 2020-08-28: 300 mL via INTRAVENOUS

## 2020-08-28 MED ORDER — BUPIVACAINE IN DEXTROSE 0.75-8.25 % IT SOLN
INTRATHECAL | Status: DC | PRN
  Administered 2020-08-28: 1.6 mL via INTRATHECAL

## 2020-08-28 MED ORDER — SCOPOLAMINE 1 MG/3DAYS TD PT72: 1.0000 | MEDICATED_PATCH | Freq: Once | TRANSDERMAL | Status: DC

## 2020-08-28 MED ORDER — OXYTOCIN-SODIUM CHLORIDE 30-0.9 UT/500ML-% IV SOLN
INTRAVENOUS | Status: AC
  Filled 2020-08-28: qty 500

## 2020-08-28 SURGICAL SUPPLY — 39 items
BENZOIN TINCTURE PRP APPL 2/3 (GAUZE/BANDAGES/DRESSINGS) ×2 IMPLANT
CHLORAPREP W/TINT 26ML (MISCELLANEOUS) ×2 IMPLANT
CLAMP CORD UMBIL (MISCELLANEOUS) IMPLANT
CLIP FILSHIE TUBAL LIGA STRL (Clip) IMPLANT
CLOSURE STERI STRIP 1/2 X4 (GAUZE/BANDAGES/DRESSINGS) ×2 IMPLANT
CLOTH BEACON ORANGE TIMEOUT ST (SAFETY) ×2 IMPLANT
DRSG OPSITE POSTOP 4X10 (GAUZE/BANDAGES/DRESSINGS) ×2 IMPLANT
ELECT REM PT RETURN 9FT ADLT (ELECTROSURGICAL) ×2
ELECTRODE REM PT RTRN 9FT ADLT (ELECTROSURGICAL) ×1 IMPLANT
EXTRACTOR VACUUM KIWI (MISCELLANEOUS) IMPLANT
GLOVE BIO SURGEON STRL SZ 6.5 (GLOVE) ×2 IMPLANT
GLOVE BIOGEL PI IND STRL 7.0 (GLOVE) ×2 IMPLANT
GLOVE BIOGEL PI INDICATOR 7.0 (GLOVE) ×2
GOWN STRL REUS W/TWL LRG LVL3 (GOWN DISPOSABLE) ×6 IMPLANT
KIT ABG SYR 3ML LUER SLIP (SYRINGE) IMPLANT
NEEDLE HYPO 22GX1.5 SAFETY (NEEDLE) IMPLANT
NEEDLE HYPO 25X5/8 SAFETYGLIDE (NEEDLE) IMPLANT
NS IRRIG 1000ML POUR BTL (IV SOLUTION) ×2 IMPLANT
PACK C SECTION WH (CUSTOM PROCEDURE TRAY) ×2 IMPLANT
PAD ABD 7.5X8 STRL (GAUZE/BANDAGES/DRESSINGS) ×2 IMPLANT
PAD OB MATERNITY 4.3X12.25 (PERSONAL CARE ITEMS) ×2 IMPLANT
PENCIL SMOKE EVAC W/HOLSTER (ELECTROSURGICAL) ×2 IMPLANT
RTRCTR C-SECT PINK 25CM LRG (MISCELLANEOUS) ×2 IMPLANT
SPONGE GAUZE 4X4 12PLY STER LF (GAUZE/BANDAGES/DRESSINGS) ×4 IMPLANT
STRIP CLOSURE SKIN 1/2X4 (GAUZE/BANDAGES/DRESSINGS) ×2 IMPLANT
SUT CHROMIC 2 0 CT 1 (SUTURE) ×4 IMPLANT
SUT MNCRL 0 VIOLET CTX 36 (SUTURE) ×2 IMPLANT
SUT MONOCRYL 0 CTX 36 (SUTURE) ×2
SUT PDS AB 0 CTX 60 (SUTURE) ×2 IMPLANT
SUT PLAIN 2 0 (SUTURE)
SUT PLAIN 2 0 XLH (SUTURE) ×2 IMPLANT
SUT PLAIN ABS 2-0 CT1 27XMFL (SUTURE) IMPLANT
SUT VIC AB 0 CTX 36 (SUTURE) ×2
SUT VIC AB 0 CTX36XBRD ANBCTRL (SUTURE) ×2 IMPLANT
SUT VIC AB 4-0 KS 27 (SUTURE) ×2 IMPLANT
SYR CONTROL 10ML LL (SYRINGE) IMPLANT
TOWEL OR 17X24 6PK STRL BLUE (TOWEL DISPOSABLE) ×2 IMPLANT
TRAY FOLEY W/BAG SLVR 14FR LF (SET/KITS/TRAYS/PACK) ×2 IMPLANT
WATER STERILE IRR 1000ML POUR (IV SOLUTION) ×2 IMPLANT

## 2020-08-28 NOTE — Transfer of Care (Signed)
Immediate Anesthesia Transfer of Care Note  Patient: Debbie Fernandez  Procedure(s) Performed: CESAREAN SECTION (N/A )  Patient Location: PACU  Anesthesia Type:Spinal  Level of Consciousness: awake, alert  and oriented  Airway & Oxygen Therapy: Patient Spontanous Breathing  Post-op Assessment: Report given to RN and Post -op Vital signs reviewed and stable  Post vital signs: Reviewed and stable  Last Vitals:  Vitals Value Taken Time  BP 133/83 08/28/20 1245  Temp 36.5 C 08/28/20 1245  Pulse 63 08/28/20 1245  Resp 18 08/28/20 1245  SpO2 99 % 08/28/20 1245    Last Pain:  Vitals:   08/28/20 1245  TempSrc:   PainSc: 0-No pain         Complications: No complications documented.

## 2020-08-28 NOTE — Anesthesia Procedure Notes (Signed)
Spinal  Patient location during procedure: OR Start time: 08/28/2020 10:26 AM End time: 08/28/2020 10:30 AM Reason for block: surgical anesthesia Staffing Performed: anesthesiologist  Anesthesiologist: Beryle Lathe, MD Preanesthetic Checklist Completed: patient identified, IV checked, risks and benefits discussed, surgical consent, monitors and equipment checked, pre-op evaluation and timeout performed Spinal Block Patient position: sitting Prep: DuraPrep Patient monitoring: heart rate, cardiac monitor, continuous pulse ox and blood pressure Approach: midline Location: L3-4 Injection technique: single-shot Needle Needle type: Pencan  Needle gauge: 24 G Additional Notes Consent was obtained prior to the procedure with all questions answered and concerns addressed. Risks including, but not limited to, bleeding, infection, nerve damage, paralysis, failed block, inadequate analgesia, allergic reaction, high spinal, itching, and headache were discussed and the patient wished to proceed. Functioning IV was confirmed and monitors were applied. Sterile prep and drape, including hand hygiene, mask, and sterile gloves were used. The patient was positioned and the spine was prepped. The skin was anesthetized with lidocaine. Free flow of clear CSF was obtained prior to injecting local anesthetic into the CSF. The spinal needle aspirated freely following injection. The needle was carefully withdrawn. The patient tolerated the procedure well.   Leslye Peer, MD

## 2020-08-28 NOTE — Anesthesia Postprocedure Evaluation (Signed)
Anesthesia Post Note  Patient: Debbie Fernandez  Procedure(s) Performed: CESAREAN SECTION (N/A )     Patient location during evaluation: PACU Anesthesia Type: Spinal Level of consciousness: awake and alert Pain management: pain level controlled Vital Signs Assessment: post-procedure vital signs reviewed and stable Respiratory status: spontaneous breathing and respiratory function stable Cardiovascular status: blood pressure returned to baseline and stable Postop Assessment: spinal receding and no apparent nausea or vomiting Anesthetic complications: no   No complications documented.  Last Vitals:  Vitals:   08/28/20 1230 08/28/20 1245  BP:  133/83  Pulse: 60 63  Resp: 17 18  Temp: 36.5 C 36.5 C  SpO2: 99% 99%    Last Pain:  Vitals:   08/28/20 1245  TempSrc:   PainSc: 0-No pain   Pain Goal:    LLE Motor Response: Purposeful movement (08/28/20 1230)   RLE Motor Response: Purposeful movement (08/28/20 1230)       Epidural/Spinal Function Cutaneous sensation: Able to Discern Pressure (08/28/20 1230), Patient able to flex knees: Yes (08/28/20 1230), Patient able to lift hips off bed: No (08/28/20 1230), Back pain beyond tenderness at insertion site: No (08/28/20 1230), Progressively worsening motor and/or sensory loss: No (08/28/20 1230), Bowel and/or bladder incontinence post epidural: No (08/28/20 1230)  Beryle Lathe

## 2020-08-28 NOTE — Op Note (Signed)
Cesarean Section Procedure Note  Debbie Fernandez  DOB:    [birth year]  MRN:    [id number]  Date of Surgery:  08/28/2020  Indication: 39 week 4 day [facility] admitted for scheduled primary cesarean section for suspected macrosomia  Pre-operative Diagnosis: Suspected macrosomia   Post-operative Diagnosis: same  Procedure:  Primary cesarean delivery                         Surgeon: Wynonia Hazard, MD  Assistants: Aldine Contes, CNM  Anesthesia: Spinal anesthesia  ASA Class: 2  Procedure Details   The patient was counseled about the risks, benefits, complications of the cesarean section. The patient concurred with the proposed plan, giving informed consent.  The site of surgery properly noted/marked. The patient was taken to Operating Room A, identified as Debbie Fernandez and the procedure verified as C-Section Delivery. A Time Out was held and the above information confirmed.  After spinal was found to adequate , the patient was placed in the dorsal supine position with a leftward tilt, draped and prepped in the usual sterile manner. A Pfannenstiel incision was made with a 10 blade scalpel and the incision carried down through the subcutaneous tissue to the fascia.  The fascia was incised in the midline and the fascial incision was extended laterally with Mayo scissors. The superior aspect of the fascial incision was grasped with Coker clamps x2, tented up and the rectus muscles dissected off sharply with the bovie.  The rectus was then dissected off with blunt dissection and the bovie inferiorly. The rectus muscles were separated in the midline. The abdominal peritoneum was identified, and bluntly entered using surgeons fingers. The peritoneal opening was bluntly extended with gentle pulling.   The Alexis retractor was then deployed. The vesicouterine peritoneum was identified, tented up, entered sharply with Metzenbaum scissors, and the bladder flap was created digitally. Scalpel was  then used to make a low transverse incision on the uterus which was extended laterally with  blunt dissection. The fetal vertex was identified and delivered from cephalic presentation.  A live healthy Female with Apgar scores of 8 at one minute and 9 at five minutes. The placenta was separating early so the umbilical cord was quickly clamped and cut, the baby was handed off to waiting Pediatricians. Cord blood was obtained for evaluation. The placenta was spontaneously removed intact. The placenta was handed off to be sent as a specimen for Pathology as there was a possible early abruption.   The uterus was cleared of all clot and debris. The uterine incision was repaired with #0 Monocryl in running locked fashion. A second imbricating suture was performed using the same suture. The incision was hemostatic. Ovaries and tubes were inspected and normal. The Alexis retractor was removed. The abdominal cavity was cleared of all clot and debris. The abdominal peritoneum was reapproximated with 2-0 chromic  in a running fashion, the rectus muscles was reapproximated with #2 chromic in interrupted fashion.The fascia was closed with 0 Vicryl in a running fashion starting from each end and meeting in the middle.The subcuticular layer was irrigated and all bleeders cauterized.  30 mL of 0.5% Marcaine was injected into the subcutaneous layer.  The Scarpas fascia was re-approximated with interrupted sutures of 2-0 plain.   The skin was closed with 4-0 vicryl in a subcuticular fashion using a Mellody Dance needle. The incision was dressed with benzoine, steri strips and pressure dressing. All sponge lap and needle counts were  correct x3.   Patient tolerated the procedure well and recovered in stable condition following the procedure.  Instrument, sponge, and needle counts were correct prior the abdominal closure and at the conclusion of the case.   Findings: Live female infant, Apgars 8/9, clear amniotic fluid, placenta 3 vessels  possible aburption, normal uterus, bilateral tubes and ovaries  Estimated Blood Loss: 451 mL  IVF:  800  mL LR         Drains: Foley catheter  Urine output: 100 mL clear         Specimens: Placenta to Pathology         Implants: none         Complications:  None; patient tolerated the procedure well.         Disposition: PACU - hemodynamically stable.  Attending Attestation: I PERFORMED THE PROCEDURE AND MY ASSISTANT WAS NEEDED FOR THE COMPLEXITY OF THE CASE   Mora Appl, Sanjuana Mae

## 2020-08-28 NOTE — Lactation Note (Addendum)
This note was copied from a baby's chart. Lactation Consultation Note  Patient Name: Debbie Fernandez Date: 08/28/2020 Reason for consult: Follow-up assessment;Mother's request;Difficult latch;Term Age:35 hours   Mom requested use of NS size 20 and 24. LC set Mom up on DEBP since use of NS can affect milk supply. Mom sized 20 states comfortable fit.   Mom manual pump pre pump 5-10 minutes to extend her short shafted nipples. Mom given breast shells to wear when she is not pumping, sleeping or nursing. Parts, assembly, and cleaning reviewed.   LC assisted Mom latching on the left breast with 20 NS with signs of milk transfer with breast compression. Infant has a thick labial attachment, recessed chin and high palate. LC worked with Mom flanging lips using a chin tug and keeping cheeks/nose touching the breasts. Mom denied any pain with the latch.   Plan 1. To feed based on cues 8-12x in 24 hr period no more than 4 hrs without an attempt. Mom to offer both breast during a feed and if needed use NS.         2. Mom taught hand expression to offer drops of colostrum via spoon or finger feeding if unable to get infant to latch.      3 Pump using DEBP q 3 hrs for  All questions answered at the end of the visit.    Maternal Data Has patient been taught Hand Expression?: Yes Does the patient have breastfeeding experience prior to this delivery?: No  Feeding Mother's Current Feeding Choice: Breast Milk  LATCH Score                    Lactation Tools Discussed/Used Tools: Pump;Flanges Nipple shield size: 20;24 Flange Size: 24 Breast pump type: Double-Electric Breast Pump Pump Education: Setup, frequency, and cleaning;Milk Storage Reason for Pumping: Increase stimulation Pumping frequency: every 3 hrs for  Interventions Interventions: Breast feeding basics reviewed;DEBP;Education;Expressed milk;Breast massage;Hand express;Pre-pump if  needed  Discharge Pump: Personal WIC Program: No  Consult Status Consult Status: Follow-up Date: 08/29/20 Follow-up type: In-patient    Debbie Sanor  Fernandez 08/28/2020, 6:35 PM

## 2020-08-28 NOTE — Lactation Note (Signed)
This note was copied from a baby's chart. Lactation Consultation Note  Patient Name: Debbie Fernandez [id number] Date: 08/28/2020 Reason for consult: Initial assessment Age:35 Hours Mother P1 C/S today. Infant is LGA at 10.0 lbs  Mother has flat Nipples. She has been unable to get infant latched. Mother is concerned that infant maybe hungry.  Staff nurse has attempt to assist mother multiple times and thinks infant may need a NS.   Mother was given [facility] brochure and basic teaching was done. Infant placed to the breast and showing no feeding cues.   Discussed early, mid and late feeding cues with mother.  Ask mother if ok if I put gloved finger in his mouth to stimulate sucking reflex. Infant sucked on gloved finger.  Infant has a very high arched palate and a short anterior frenula.  Infant began with a few shallow sucks at the breast but unable to sustain and get a deep latch.  We did fit a #20 and a #24 NS at mothers request. Infant latched to the #20 on and off the shaft of the shield no sustaining depth for about 15 mins. Infant was fed colostrum in a spoon after feeding.   Mothers plan to attempt to feed with a NS and then hand express and spoon feed. Mother has a hand pump at the bedside.     Maternal Data Has patient been taught Hand Expression?: Yes Does the patient have breastfeeding experience prior to this delivery?: No  Feeding Mother's Current Feeding Choice: Breast Milk  LATCH Score                    Lactation Tools Discussed/Used Tools: Nipple Shields Nipple shield size: 20;24  Interventions Interventions: Assisted with latch;Skin to skin;Hand express;Adjust position;Support pillows;Breast compression;Hand pump  Discharge    Consult Status Consult Status: Follow-up Date: 08/28/20 Follow-up type: In-patient    Stevan Born [facility] 08/28/2020, 5:32 PM

## 2020-08-28 NOTE — Anesthesia Preprocedure Evaluation (Signed)
Anesthesia Evaluation  Patient identified by MRN, date of birth, ID band Patient awake    Reviewed: Allergy & Precautions, NPO status , Patient's Chart, lab work & pertinent test results  History of Anesthesia Complications Negative for: history of anesthetic complications  Airway Mallampati: II  TM Distance: >3 FB Neck ROM: Full    Dental  (+) Dental Advisory Given   Pulmonary neg pulmonary ROS,    Pulmonary exam normal        Cardiovascular negative cardio ROS Normal cardiovascular exam     Neuro/Psych PSYCHIATRIC DISORDERS Anxiety Depression negative neurological ROS     GI/Hepatic negative GI ROS, Neg liver ROS,   Endo/Other  Morbid obesity  Renal/GU negative Renal ROS     Musculoskeletal negative musculoskeletal ROS (+)   Abdominal (+) + obese,   Peds  Hematology  (+) anemia ,  Plt 291k    Anesthesia Other Findings Covid test negative   Reproductive/Obstetrics (+) Pregnancy                             Anesthesia Physical Anesthesia Plan  ASA: III  Anesthesia Plan: Spinal   Post-op Pain Management:    Induction:   PONV Risk Score and Plan: 2 and Treatment may vary due to age or medical condition, Ondansetron and Scopolamine patch - Pre-op  Airway Management Planned: Natural Airway  Additional Equipment: None  Intra-op Plan:   Post-operative Plan:   Informed Consent: I have reviewed the patients History and Physical, chart, labs and discussed the procedure including the risks, benefits and alternatives for the proposed anesthesia with the patient or authorized representative who has indicated his/her understanding and acceptance.       Plan Discussed with: CRNA and Anesthesiologist  Anesthesia Plan Comments: (Labs reviewed, platelets acceptable. Discussed risks and benefits of spinal, including spinal/epidural hematoma, infection, failed block, and PDPH. Patient  expressed understanding and wished to proceed. )        Anesthesia Quick Evaluation

## 2020-08-28 NOTE — H&P (Addendum)
OB ADMISSION/ HISTORY & PHYSICAL:  Admission Date: 08/28/2020  7:56 AM  Admit Diagnosis: Cesarean Section  Debbie Fernandez is a 35 y.o. female G2P0010 [redacted]w[redacted]d presenting for elective primary cesarean section for macrosomia. Endorses active FM, denies ctx, LOF and vaginal bleeding.  History of current pregnancy: G2P0010   Primary OB Provider: [facility] Patient entered care with [facility] at 6.5 wks.   EDC 08/31/20 by LMP and congruent w/ 6.2 wk U/S.   Anatomy scan:  22.3 wks, complete w/ anterior placenta.   Last evaluation: 38.6  wks  Significant prenatal events: LGA Patient Active Problem List   Diagnosis Date Noted  . Depression 02/13/2017  . Pregnancy examination or test, negative result 02/13/2017  . Nexplanon removal 02/13/2017  . Contraceptive management 05/04/2013    Prenatal Labs: ABO, Rh: --/--/O POS (05/23 1035) Antibody: NEG (05/23 1035) Rubella: Immune (10/06 0000)  RPR: Nonreactive (10/06 0000)  HBsAg: Negative (10/06 0000)  HIV: Non-reactive (10/06 0000)  GTT: 113 GBS:   Negative GC/CHL: negative/negative Genetics: Panorama low risk, Horizon negative Tdap/influenza vaccines: declined flu   OB History  Gravida Para Term Preterm AB Living  2       1 0  SAB IAB Ectopic Multiple Live Births    1          # Outcome Date GA Lbr Len/2nd Weight Sex Delivery Anes PTL Lv  2 Current           1 IAB             Medical / Surgical History: Past medical history:  Past Medical History:  Diagnosis Date  . Anxiety   . Contraceptive management 05/04/2013  . Hx of chlamydia infection     Past surgical history:  Past Surgical History:  Procedure Laterality Date  . colonascopy     Family History:  Family History  Adopted: Yes  Problem Relation Age of Onset  . Hypertension Mother   . Diabetes Mother   . Cancer Mother        cervical  . Diabetes Maternal Grandmother   . Hypertension Maternal Grandmother   . Cancer Maternal Grandfather     Social History:  reports  that she has never smoked. She has never used smokeless tobacco. She reports that she does not drink alcohol and does not use drugs.  Allergies: Strawberry extract   Current Medications at time of admission:  Prior to Admission medications   Medication Sig Start Date End Date Taking? Authorizing Provider  Prenatal Vit-Fe Fumarate-FA (PRENATAL MULTIVITAMIN) TABS tablet Take 1 tablet by mouth daily at 12 noon.   Yes [provider]  Prenatal Vit w/Fe-Methylfol-FA (PNV PO) Take by mouth.    [provider]    Review of Systems: Constitutional: Negative   HENT: Negative   Eyes: Negative   Respiratory: Negative   Cardiovascular: Negative   Gastrointestinal: Negative  Genitourinary: negative for bloody show, negative for LOF   Musculoskeletal: Negative   Skin: Negative   Neurological: Negative   Endo/Heme/Allergies: Negative   Psychiatric/Behavioral: Negative    Physical Exam: VS: Blood pressure (!) 142/96, pulse 80, temperature 98.3 F (36.8 C), temperature source Oral, resp. rate 18, last menstrual period 11/25/2019. AAO x3, no signs of distress Cardiovascular: RRR Respiratory: Lung fields clear to ausculation GU/GI: Abdomen gravid, non-tender, non-distended, active FM, vertex Extremities: generalized edema, negative for pain, tenderness, and cords  Doppler: 142bpm   Prenatal Transfer Tool  Maternal Diabetes: No Genetic Screening: Normal Maternal Ultrasounds/Referrals:  Normal Fetal Ultrasounds or other Referrals:  None Maternal Substance Abuse:  No Significant Maternal Medications:  None Significant Maternal Lab Results: Group B Strep negative    Assessment: 35 y.o. G2P0010 [redacted]w[redacted]d elective primary cesarean section for possible LGA. U/S per MFM on 08/13/20 EFW 10+5, 99%   FHR category 1 GBS negative Pain management plan: per anesthesia    Plan:  Admit to L&D Routine cesarean admission orders  Dr Mora Appl notified of admission and plan of  care  Carollee Leitz MSN, CNM 08/28/2020 9:00 AM

## 2020-08-29 DIAGNOSIS — Z98891 History of uterine scar from previous surgery: Secondary | ICD-10-CM

## 2020-08-29 DIAGNOSIS — O1490 Unspecified pre-eclampsia, unspecified trimester: Secondary | ICD-10-CM | POA: Diagnosis not present

## 2020-08-29 DIAGNOSIS — D62 Acute posthemorrhagic anemia: Secondary | ICD-10-CM | POA: Diagnosis not present

## 2020-08-29 LAB — CBC
HCT: 26.1 % — ABNORMAL LOW (ref 36.0–46.0)
Hemoglobin: 8.2 g/dL — ABNORMAL LOW (ref 12.0–15.0)
MCH: 27.7 pg (ref 26.0–34.0)
MCHC: 31.4 g/dL (ref 30.0–36.0)
MCV: 88.2 fL (ref 80.0–100.0)
Platelets: 241 10*3/uL (ref 150–400)
RBC: 2.96 MIL/uL — ABNORMAL LOW (ref 3.87–5.11)
RDW: 14.1 % (ref 11.5–15.5)
WBC: 12.4 10*3/uL — ABNORMAL HIGH (ref 4.0–10.5)
nRBC: 0 % (ref 0.0–0.2)

## 2020-08-29 MED ORDER — AMMONIA AROMATIC IN INHA
RESPIRATORY_TRACT | Status: AC
  Filled 2020-08-29: qty 10

## 2020-08-29 NOTE — Social Work (Signed)
CSW received consult for hx of Anxiety and Depression.  CSW met with MOB to offer support and complete assessment.   CSW met with MOB at bedside. CSW observed MOB lying in bed, infant sleeping in bassinet and FOB sleeping on couch. CSW congratulated MOB and introduced role. MOB presented calm and receptive to CSW visit. CSW inquired how MOB has felt since giving birth. MOB reports excited but still having pain. CSW inquired about MOB pregnancy. MOB reports she had normal symptoms like heartburn but overall good.   CSW inquired about MOB history of depression and anxiety. MOB acknowledges she has depression and anxiety however she was never officially diagnosed. MOB reports her depression and anxiety is situational. She reports since losing her job during the pandemic she has not had any symptoms of depression. MOB reports she has been in therapy at [facility] for the past four years, every two weeks. MOB reports therapy has been very helpful. CSW praised MOB for her ongoing therapy. CSW provided education regarding the baby blues period vs. perinatal mood disorders. CSW recommended MOB complete a self-evaluation during the postpartum time period using the New Mom Checklist from Postpartum Progress and encouraged MOB to contact a medical professional if symptoms are noted. MOB receptive to the resources. CSW assessed MOB for safety. MOB denies thoughts of harm to self and others. CSW inquired about MOB supports. MOB acknowledges FOB and sister as supports.   MOB knowledgeable of some Sudden Infant Death Syndrome (SIDS) precautions. CSW provided review of SIDS precautions and informed MOB no-co sleeping with the infant. MOB reports the infant will sleep in a bassinet and then transition to a crib. MOB reports she has all essential items for the infant including a car seat. MOB has chosen [facility]. CSW assessed MOB for additional needs. MOB reports no further need.   CSW identifies no further  need for intervention and no barriers to discharge at this time.  Shakeisha Horine, MSW, LCSW [facility]  Clinical Social Worker  [phone] 

## 2020-08-29 NOTE — Progress Notes (Signed)
Debbie Fernandez [id number] Postpartum Postoperative Day # 1  BOYD LITAKER, [id number], [redacted]w[redacted]d, S/P primary LT Cesarean Section due to fetal macrosomia on the 5/24 with Dr Alwyn Pea, EBL 439mls, hgb drop of 10.7-8.2, placed on PO Iron, asymptomatic. Pt was Dx and met criteria for preeclampsia without SF with elevated BP and PCR of 0.38, CMP and CBC WNL. BP currently 112/79. Pthas dx of depression and anxiety, no meds, attends therapy and declined medication at this time. Mood stable, denies HI/SI. Baby Female desires in pt circ today.   Patient Active Problem List   Diagnosis Date Noted  . Status post primary low transverse cesarean section 08/29/2020  . Acute blood loss anemia 08/29/2020  . Normal postpartum course 08/29/2020  . Preeclampsia 08/29/2020  . Macrosomia 08/28/2020  . Depression 02/13/2017     Active Ambulatory Problems    Diagnosis Date Noted  . Depression 02/13/2017   Resolved Ambulatory Problems    Diagnosis Date Noted  . Contraceptive management 05/04/2013  . Pregnancy examination or test, negative result 02/13/2017  . Nexplanon removal 02/13/2017   Past Medical History:  Diagnosis Date  . Anxiety   . Hx of chlamydia infection      Subjective: Patient up ad lib, denies syncope or dizziness. Reports consuming regular diet without issues and denies N/V. Patient reports 0 bowel movement + passing flatus.  Denies issues with urination and reports bleeding is "lighter."  Patient is breastfeeding and reports going well.  Desires declined for postpartum contraception.  Pain is being appropriately managed with use of po meds. Modd stable, denies HI/SI.  Denies SOB, dizziness or heart palpitations. Denies HA, RUQ pain or vision changes.    Objective: Patient Vitals for the past 24 hrs:  BP Temp Temp src Pulse Resp SpO2  08/29/20 0600 112/79 98.1 F (36.7 C) Oral 64 18 99 %  08/29/20 0213 114/81 98.3 F (36.8 C) Oral (!) 55 18 99 %  08/28/20 2208 132/84 98.8 F (37.1 C)  Oral 67 18 98 %  08/28/20 2100 -- -- -- 70 16 98 %  08/28/20 1600 135/66 98.3 F (36.8 C) Oral 65 17 99 %  08/28/20 1349 132/88 97.7 F (36.5 C) Oral (!) 59 18 --  08/28/20 1245 133/83 97.7 F (36.5 C) Oral 63 18 99 %  08/28/20 1230 -- 97.7 F (36.5 C) Oral 60 17 99 %  08/28/20 1215 134/72 -- -- 60 17 98 %  08/28/20 1200 130/85 -- -- 66 15 97 %  08/28/20 1145 129/78 -- -- 73 15 --  08/28/20 1136 127/73 98 F (36.7 C) -- 69 18 97 %     Physical Exam:  General: alert, cooperative, appears stated age and no distress Mood/Affect: happy Lungs: clear to auscultation, no wheezes, rales or rhonchi, symmetric air entry.  Heart: normal rate, regular rhythm, normal S1, S2, no murmurs, rubs, clicks or gallops. Breast: breasts appear normal, no suspicious masses, no skin or nipple changes or axillary nodes. Abdomen:  + bowel sounds, soft, non-tender Incision: healing well, no significant drainage, no dehiscence, no significant erythema, Honeycomb dressing  Uterine Fundus: firm, involution -1 Lochia: appropriate Skin: Warm, Dry. DVT Evaluation: No evidence of DVT seen on physical exam. Negative Homan's sign. No cords or calf tenderness. No significant calf/ankle edema.  Labs: Recent Labs    08/27/20 1037 08/29/20 0520  HGB 10.7* 8.2*  HCT 34.6* 26.1*  WBC 8.8 12.4*    CBG (last 3)  No results for input(s): GLUCAP in  the last 72 hours.   I/O: I/O last 3 completed shifts: In: 4373 [P.O.:720; I.V.:500; IV Piggyback:200] Out: 1701 [Urine:1250; Blood:451]   Assessment Postpartum Postoperative Day # 1  Debbie Fernandez, G2P1011, [redacted]w[redacted]d, S/P primary LT Cesarean Section due to fetal macrosomia on the 5/24 with Dr Alwyn Pea, EBL 490mls, hgb drop of 10.7-8.2, placed on PO Iron, asymptomatic. Pt was Dx and met criteria for preeclampsia without SF with elevated BP and PCR of 0.38, CMP and CBC WNL, asymptomatic. BP currently 112/79. Pthas dx of depression and anxiety, no meds, attends therapy  and declined medication at this time. Mood stable, denies HI/SI. Baby Female desires in pt circ today.   Pt stable. -1 Involution. BreastFeeding. Hemodynamically Stable.  Plan: Continue other mgmt as ordered PreE w/o SF: Monitor BP, no meds required at this time. VTE Prophylactics: SCD, ambulated as tolerates.  Pain control: Motrin/Tylenol/Narcotics PRN Education given regarding options for contraception, including barrier methods, injectable contraception, IUD placement, oral contraceptives.  Breastfeeding, Lactation consult, Circumcision prior to discharge and Contraception declined  Plan for d/ either POD#2-3.   Dr. Mancel Bale to be updated on patient status  [facility] NP-C, CNM 08/29/2020, 10:06 AM

## 2020-08-29 NOTE — Lactation Note (Signed)
This note was copied from a baby's chart. Lactation Consultation Note  Patient Name: Debbie Fernandez [id number] Date: 08/29/2020 Reason for consult: Follow-up assessment Age:35 hours   Mother reports that infant breastfed for 20 mins with #20 NS . Mother ask did she see milk in the shield. Nipple shield on table and observed small amt of saliva in the shield.  Mother reports that she just pumped and didn't get anything. Reviewed hand expression. Mother reports that she is only getting drops this time. LC offered to assist her. Infant has been getting small amts of hand express ebm with a spoon.  Mother is aware that DBM is available. She declined DBM to nurse earlier. Mother was given suggest supplemental amts guidelines.   Mother has infant STS at present.  Encouraged mother to continue to pump after each feedings consistently to protect her milk supply. Encouraged mother to massage and hand express freq. Mother receptive to teaching.  Mother advised to page LC to observe next feeding.   Maternal Data    Feeding Mother's Current Feeding Choice: Breast Milk  LATCH Score Latch: Repeated attempts needed to sustain latch, nipple held in mouth throughout feeding, stimulation needed to elicit sucking reflex.  Audible Swallowing: None  Type of Nipple: Flat  Comfort (Breast/Nipple): Soft / non-tender  Hold (Positioning): Assistance needed to correctly position infant at breast and maintain latch.  LATCH Score: 5   Lactation Tools Discussed/Used Tools: Nipple Shields  Interventions    Discharge    Consult Status Consult Status: Follow-up Date: 08/29/20 Follow-up type: In-patient    Stevan Born [facility] 08/29/2020, 3:16 PM

## 2020-08-30 LAB — SURGICAL PATHOLOGY

## 2020-08-30 MED ORDER — ONDANSETRON 4 MG PO TBDP
8.0000 mg | ORAL_TABLET | Freq: Three times a day (TID) | ORAL | Status: DC | PRN
  Administered 2020-08-30: 8 mg via ORAL
  Filled 2020-08-30: qty 2

## 2020-08-30 NOTE — Lactation Note (Signed)
This note was copied from a baby's chart. Lactation Consultation Note  Patient Name: Debbie Fernandez [id number] Date: 08/30/2020 Reason for consult: Follow-up assessment;Term Age:35 hours   P1 mother whose infant is now 76 hours old.  This is a term baby at 39+4 weeks with a 9% weight loss this morning.  Returned after my earlier visit to assist with education and latching baby to the breast.  Baby was very sleepy and not showing any feeding cues.  Mother is having difficulty with latching and feeding.  Baby was asleep at mother's side.  Removed clothing and demonstrated how to awaken baby.  Assessed his suck to be weak and suck training performed.  Asked mother to demonstrate hand expression but she was unable to express drops until I revised her technique.  Drops given via finger to help awaken baby.  Mother interested in trying to latch without the NS.  Her nipples are short shafted.  Latched baby to the breast, however, he was not at all interested in initiating a suck.  Obtained donor breast milk and used the curved tip syringe while latched to entice him to begin sucking.  With constant stimulation and a couple of breaks to burp and awaken him, he fed for 20 minutes and consumed 25 mls of donor milk.  Burped and swaddled after feeding.  Stressed the importance of mother pumping after every feeding for 15 minutes.  Changed the flange size from a #24 to a #27 for better fit.  Mother denied pain with pumping.    Worked with mother to devise a feeding plan of breast feeding (with or without the NS) followed by donor breast milk supplementation of 30+ mls at every feeding.  Mother will pump after feedings for 15 minutes and feed back her EBM prior to donor milk.  Mother feeling encouraged after she saw progress with this feeding.  Reminded her of the weight loss this morning and that mother/father will need to keep this plan up all day and night.  Mother verbalized understanding.  Father present at  the end of the session.  Mother has a DEBP for home use.  RN updated.   Maternal Data Has patient been taught Hand Expression?: Yes Does the patient have breastfeeding experience prior to this delivery?: No  Feeding Mother's Current Feeding Choice: Breast Milk and Formula  LATCH Score Latch: Repeated attempts needed to sustain latch, nipple held in mouth throughout feeding, stimulation needed to elicit sucking reflex.  Audible Swallowing: Spontaneous and intermittent (Due to donor breast milk in curved tip syringe)  Type of Nipple: Everted at rest and after stimulation (Short shafted)  Comfort (Breast/Nipple): Soft / non-tender  Hold (Positioning): Assistance needed to correctly position infant at breast and maintain latch.  LATCH Score: 8   Lactation Tools Discussed/Used Tools: Shells;Pump;Flanges;Nipple Shields Nipple shield size: 20;24 Flange Size: 27 Breast pump type: Double-Electric Breast Pump;Manual Pump Education: Setup, frequency, and cleaning;Milk Storage (Reviewed) Reason for Pumping: Stimulation for supplementation; mother using a NS as needed Pumping frequency: Every three hours  Interventions Interventions: Breast feeding basics reviewed;Assisted with latch;Skin to skin;Breast massage;Hand express;Breast compression;Adjust position;DEBP;Hand pump;Shells;Position options;Support pillows;Education  Discharge Pump: DEBP;Manual;Personal  Consult Status Consult Status: Follow-up Date: 08/31/20 Follow-up type: In-patient    Eh Sesay R Charlot Gouin 08/30/2020, 11:11 AM

## 2020-08-30 NOTE — Lactation Note (Signed)
This note was copied from a baby's chart. Lactation Consultation Note  Patient Name: Debbie Fernandez Date: 08/30/2020 Reason for consult: Follow-up assessment Age:35 hours   LC Follow Up Visit:  Mother received her breakfast as I entered; will allow her time to finish and return for an assessment of baby's feeding and help establish a feeding plan for today.  MD in room and will keep mother an additional night to work on feedings.  Baby has a 9% weight loss this morning.   Maternal Data    Feeding    LATCH Score                    Lactation Tools Discussed/Used    Interventions    Discharge    Consult Status Consult Status: Follow-up Date: 08/30/20 Follow-up type: In-patient    Debbie Fernandez 08/30/2020, 9:02 AM

## 2020-08-30 NOTE — Progress Notes (Signed)
Subjective: POD# 2 Information for the patient's newborn:  Debbie Fernandez, Debbie Fernandez [[id number]]  female    Circumcision completed on 5/25  Reports feeling tired, but good. Desires to stay for another day for breastfeeding support.  Feeding: breast Reports tolerating PO and denies N/V, foley removed, ambulating and urinating w/o difficulty  Pain controlled with PO meds Denies HA/SOB/dizziness  Flatus passing Vaginal bleeding is normal, no clots     Objective:  VS:  Vitals:   08/29/20 1345 08/29/20 2049 08/30/20 0546 08/30/20 0936  BP: 130/85 (!) 141/89 (!) 143/94 (!) 143/94  Pulse: 64 68 67 73  Resp: 18 18 18    Temp: 98.2 F (36.8 C) 98.1 F (36.7 C) 98 F (36.7 C)   TempSrc: Oral Oral Oral   SpO2: 99% 98% 98%     Intake/Output Summary (Last 24 hours) at 08/30/2020 1004 Last data filed at 08/29/2020 1100 Gross per 24 hour  Intake --  Output 400 ml  Net -400 ml     Recent Labs    08/27/20 1037 08/29/20 0520  WBC 8.8 12.4*  HGB 10.7* 8.2*  HCT 34.6* 26.1*  PLT 291 241    Blood type: --/--/O POS (05/23 1035) Rubella: Immune (10/06 0000)    Physical Exam:  General: alert, cooperative and no distress CV: Regular rate and rhythm Resp: unlabored Abdomen: soft, nontender, normal bowel sounds Incision: clean, dry, intact and dressing in place Perineum:  Uterine Fundus: firm, below umbilicus, nontender Lochia: appropriate Ext: 1+ edema to BLE, neg for pain, tenderness, or cords   Assessment/Plan: 35 y.o.   POD# 2. 35                  Active Problems:   Macrosomia   Status post primary low transverse cesarean section   Acute blood loss anemia   Normal postpartum course   Preeclampsia   Continue routine post-op PP care          Lactation support PRN Anticipate D/C 08/31/20   09/02/20, MSN, CNM 08/30/2020, 10:04 AM

## 2020-08-30 NOTE — Progress Notes (Signed)
Patient complaining of some nausea, stating she hasn't eaten much due to resting and focusing on feeding the baby; however, she has felt it off and on through the day. Zofran IV is already ordered; however, patient does not have an IV site anymore. Called Rhea Pink, CNM requesting po Zofran order. Awaiting order. Earl Gala, Linda Hedges [place]

## 2020-08-30 NOTE — Lactation Note (Addendum)
This note was copied from a baby's chart. Lactation Consultation Note  Patient Name: Debbie Fernandez [id number] Date: 08/30/2020 Reason for consult: Follow-up assessment;Mother's request;Difficult latch;1st time breastfeeding;Term;Infant weight loss (-9% weight loss, being supplemented with donor breast milk, mom using 20 mm NS due to having flat semi short nipples and hx of infant being a poor latcher with recessive chin.) Age:35 hours, LGA greater than 9 lbs at birth. LC entered the room, mom was doing STS with infant, infant was cuing to breastfeed. Mom fitted with 20 mm NS and infant was supplemented  with  12 mls of donor breast milk at the breast with 5 [place] Feeding tube, infant sustained latched with depth, slowly supplemented the feeding was 25 minutes in length. LC encouraged mom to do breast stimulation such as gently stroking infant's shoulder, talking to infant and breast compressions to keep infant awake to BF. Infant was tiring and was given additional 20 mls of donor breast milk using 5 [place] feeding tube and LC gloved finger, infant took total of 34 mls of volume ( 30 donor breast milk and 4 mls of mom's EBM with this feeding).  Mom will continue working on infant latching at the breast using the 20 mm NS with the 5 [place] Feeding tube and will  continue to using DEBP as previously advised every 3 hours for 15 minutes on initial setting. Mom knows to supplement infant with 30+ mls  per feeding of her EBM/ and  or donor breast milk based on infant's age/ hours of life to help stabilize weight.   LC observed mom's breast are fuller and colostrum is starting to transition to mature milk.  Mom plans to pump after she eat's her dinner. Mm knows to call RN or LC if she needs further assistance with latching infant at the breast.  Maternal Data    Feeding Mother's Current Feeding Choice: Breast Milk and Donor Milk  LATCH Score Latch: Grasps breast easily, tongue down, lips flanged,  rhythmical sucking.  Audible Swallowing: Spontaneous and intermittent  Type of Nipple: Flat  Comfort (Breast/Nipple): Soft / non-tender  Hold (Positioning): Assistance needed to correctly position infant at breast and maintain latch.  LATCH Score: 8   Lactation Tools Discussed/Used Tools: Shells;Pump Nipple shield size: 20  Interventions Interventions: Assisted with latch;Breast compression;Adjust position;Support pillows;Position options;Expressed milk;Education  Discharge    Consult Status Consult Status: Follow-up Date: 08/31/20 Follow-up type: In-patient    Danelle Earthly 08/30/2020, 4:54 PM

## 2020-08-31 ENCOUNTER — Ambulatory Visit: Payer: Self-pay

## 2020-08-31 MED ORDER — NIFEDIPINE ER 30 MG PO TB24: 30.0000 mg | ORAL_TABLET | Freq: Every day | ORAL | 0 refills | Status: AC

## 2020-08-31 MED ORDER — IBUPROFEN 600 MG PO TABS: 600.0000 mg | ORAL_TABLET | Freq: Four times a day (QID) | ORAL | 0 refills | Status: AC

## 2020-08-31 MED ORDER — POLYSACCHARIDE IRON COMPLEX 150 MG PO CAPS: 150.0000 mg | ORAL_CAPSULE | Freq: Every day | ORAL | 2 refills | Status: AC

## 2020-08-31 MED ORDER — POLYSACCHARIDE IRON COMPLEX 150 MG PO CAPS
150.0000 mg | ORAL_CAPSULE | Freq: Every day | ORAL | Status: DC
  Administered 2020-08-31: 150 mg via ORAL
  Filled 2020-08-31: qty 1

## 2020-08-31 MED ORDER — OXYCODONE HCL 5 MG PO TABS: 5.0000 mg | ORAL_TABLET | ORAL | 0 refills | Status: AC | PRN

## 2020-08-31 MED ORDER — NIFEDIPINE ER OSMOTIC RELEASE 30 MG PO TB24
30.0000 mg | ORAL_TABLET | Freq: Every day | ORAL | Status: DC
  Administered 2020-08-31: 30 mg via ORAL
  Filled 2020-08-31: qty 1

## 2020-08-31 NOTE — Discharge Summary (Signed)
PCS OB Discharge Summary     Patient Name: Debbie Fernandez DOB: 20-Aug-1985 MRN: [id number]  Date of admission: 08/28/2020 Delivering MD: Essie Hart  Date of delivery: 08/28/2020 Type of delivery: Primary CS  Newborn Data: Sex: Baby female Circumcision: Done in pt Live born female  Birth Weight: 10 lb 0.1 oz (4540 g) APGAR: 8, 9  Newborn Delivery   Birth date/time: 08/28/2020 10:52:00 Delivery type: C-Section, Low Transverse Trial of labor: No C-section categorization: Primary      Feeding: breast Infant being discharge to home with mother in stable condition.   Admitting diagnosis: Macrosomia [P08.0] Intrauterine pregnancy: [redacted]w[redacted]d     Secondary diagnosis:  Active Problems:   Macrosomia   Status post primary low transverse cesarean section   Acute blood loss anemia   Normal postpartum course   Preeclampsia                                Complications: None                                                              Intrapartum Procedures: cesarean: low cervical, transverse Postpartum Procedures: none Complications-Operative and Postpartum: none Augmentation: AROM and at delivery   History of Present Illness: Debbie Fernandez is a 35 y.o. female, G2P1011, who presents at [redacted]w[redacted]d weeks gestation. The patient has been followed at  [facility]  Her pregnancy has been complicated by:  Patient Active Problem List   Diagnosis Date Noted  . Status post primary low transverse cesarean section 08/29/2020  . Acute blood loss anemia 08/29/2020  . Normal postpartum course 08/29/2020  . Preeclampsia 08/29/2020  . Macrosomia 08/28/2020  . Depression 02/13/2017     Active Ambulatory Problems    Diagnosis Date Noted  . Depression 02/13/2017   Resolved Ambulatory Problems    Diagnosis Date Noted  . Contraceptive management 05/04/2013  . Pregnancy examination or test, negative result 02/13/2017  . Nexplanon removal 02/13/2017   Past  Medical History:  Diagnosis Date  . Anxiety   . Hx of chlamydia infection      Hospital course:  Sceduled C/S   35 y.o. yo G2P1011 at [redacted]w[redacted]d was admitted to the hospital 08/28/2020 for scheduled cesarean section with the following indication:Macrosomia and Elective Primary.Delivery details are as follows:  Membrane Rupture Time/Date: 10:52 AM ,08/28/2020   Delivery Method:C-Section, Low Transverse  Details of operation can be found in separate operative note.  Patient had an uncomplicated postpartum course.  She is ambulating, tolerating a regular diet, passing flatus, and urinating well. Patient is discharged home in stable condition on  08/31/20        Newborn Data: Birth date:08/28/2020  Birth time:10:52 AM  Gender:Female  Living status:Living  Apgars:8 ,9  Weight:4540 g    POD # 3   Hospital Course--Scheduled Cesarean: Patient was admitted on 5/24 for a scheduled Primary cesarean delivery. [id number], [redacted]w[redacted]d, S/P primary LT Cesarean Section due to fetal macrosomia on the 5/24 with Dr Mora Appl, EBL , hgb drop of 10.7-8.2, placed on PO Iron, asymptomatic.  She was taken to the operating room, where Dr. Mora Appl performed a Primary LTCS under spinal anesthesia, with delivery of a  viable baby female, in pt circ done, with weight and Apgars as listed below. Infant was in good condition and remained at the patient's bedside.  The patient was taken to recovery in good condition.  Pt was Dx and met criteria for preeclampsia without SF with elevated BP and PCR of 0.38, CMP and CBC WNL. BP currently 149/78. Denies HA, RUQ pain or vision changes, started on procardia $RemoveBefo'30mg'aVPCUzYeFmc$  XL PO daily today, will be discharged with procardia. Pt has dx of depression and anxiety, no meds, attends therapy and declined medication at this time. Mood stable, denies HI/SI.  Patient planned to breast feed.  On post-op day 1, patient was doing well, tolerating a regular diet.  Throughout her stay, her physical exam was WNL, her incision was  CDI, and her vital signs remained stable.  By post-op day 1, she was up ad lib, tolerating a regular diet, with good pain control with po med.  She was deemed to have received the full benefit of her hospital stay, and was discharged home in stable condition.  Contraceptive choice was declined after educated on risk of short interval pregnancy.    Physical exam  Vitals:   08/30/20 1411 08/30/20 2148 08/31/20 0625 08/31/20 0700  BP: 125/80 (!) 149/78 135/78 135/74  Pulse: 70 67  68  Resp: $Remo'18 18  18  'tULHa$ Temp: 98.2 F (36.8 C) 97.9 F (36.6 C)  98.1 F (36.7 C)  TempSrc: Oral Oral  Oral  SpO2: 99% 97%     General: alert, cooperative and no distress Lochia: appropriate Uterine Fundus: firm Incision: Healing well with no significant drainage, No significant erythema, Dressing is clean, dry, and intact, honeycomb dressing CDI Perineum: Intact DVT Evaluation: No evidence of DVT seen on physical exam. Negative Homan's sign. No cords or calf tenderness. No significant calf/ankle edema.  Labs: Lab Results  Component Value Date   WBC 12.4 (H) 08/29/2020   HGB 8.2 (L) 08/29/2020   HCT 26.1 (L) 08/29/2020   MCV 88.2 08/29/2020   PLT 241 08/29/2020   CMP Latest Ref Rng & Units 08/28/2020  Glucose 70 - 99 mg/dL 88  BUN 6 - 20 mg/dL 6  Creatinine 0.44 - 1.00 mg/dL 0.67  Sodium 135 - 145 mmol/L 134(L)  Potassium 3.5 - 5.1 mmol/L 4.1  Chloride 98 - 111 mmol/L 105  CO2 22 - 32 mmol/L 21(L)  Calcium 8.9 - 10.3 mg/dL 8.1(L)  Total Protein 6.5 - 8.1 g/dL 5.2(L)  Total Bilirubin 0.3 - 1.2 mg/dL 0.6  Alkaline Phos 38 - 126 U/L 87  AST 15 - 41 U/L 30  ALT 0 - 44 U/L 20    Date of discharge: 08/31/2020 Discharge Diagnoses: Term Pregnancy-delivered Discharge instruction: per After Visit Summary and "Baby and Me Booklet".  After visit meds:   Activity:           unrestricted and pelvic rest Advance as tolerated. Pelvic rest for 6 weeks.  Diet:                routine Medications: PNV,  Ibuprofen, Colace, Iron and procardia 30 mg XL PO daily Postpartum contraception: Declined Condition:  Pt discharge to home with baby in stable and condition Anemia: PO Iron PreE w/o SF: 1 week F/U , procardia PO. Report s/sx HA, RUQ pain or vision changes.  Depression/Anxiety: 1 week Mood check  Meds: Allergies as of 08/31/2020      Reactions   Strawberry Extract Other (See Comments)   Feel  Like  throat closing up.      Medication List    STOP taking these medications   prenatal multivitamin Tabs tablet     TAKE these medications   ibuprofen 600 MG tablet Commonly known as: ADVIL Take 1 tablet (600 mg total) by mouth every 6 (six) hours.   iron polysaccharides 150 MG capsule Commonly known as: NIFEREX Take 1 capsule (150 mg total) by mouth daily.   NIFEdipine 30 MG 24 hr tablet Commonly known as: ADALAT CC Take 1 tablet (30 mg total) by mouth daily.   oxyCODONE 5 MG immediate release tablet Commonly known as: Oxy IR/ROXICODONE Take 1 tablet (5 mg total) by mouth every 4 (four) hours as needed for moderate pain.   PNV PO Take by mouth.            Discharge Care Instructions  (From admission, onward)         Start     Ordered   08/31/20 0000  Discharge wound care:       Comments: Take dressing off on day 5-7 postpartum.  Report increased drainage, redness or warmth. Clean with water, let soap trickle down body. Can leave steri strips on until they fall off or take them off gently at day 10. Keep open to air, clean and dry.   08/31/20 0733          Discharge Follow Up:   Follow-up [facility] Follow up.   Specialty: Obstetrics and Gynecology Why: 1 week BP/mood check and 6 weeks PPV Contact information: [address] [place] [id number] Rembert, NP-C, CNM 08/31/2020, 7:34 AM  Noralyn Pick, [place]

## 2020-08-31 NOTE — Lactation Note (Addendum)
This note was copied from a baby's chart. Lactation Consultation Note  Patient Name: Boy Ciela Mahajan TMLYY'T Date: 08/31/2020 Reason for consult: Follow-up assessment Age:35 hours  Infant is at 10 % wt loss this am. Mother report that infant is taking his feedings well Mother reports that she is pumping well. She last pumped 90 ml. Mother is using a #5 fr feeding tube to supplement infant . She is breastfeeding using a # 20 NS.then Dad is finger feeding with the #5 fr feeding tube.  Mother was advised to follow up with an LC the first of next week. mother reports she would like for me to send a message for her.  Mother has pump at home and plans to continue to pump.  Discussed treatment and prevention of engorgement.  Plan of Care : Breastfeed infant with feeding cues Supplement infant with ebm  Pump using a DEBP after each feeding for 15-20 mins.  Discussed paced bottle feeding .  Mother to continue to cue base feed infant and feed at least 8-12 times or more in 24 hours and advised to allow for cluster feeding infant as needed.  Mother to continue to due STS. Mother is aware of available LC services at [facility], B[facility]S, OP Dept, and phone # for questions or concerns about breastfeeding.  Mother receptive to all teaching and plan of care.     Maternal Data    Feeding Mother's Current Feeding Choice: Breast Milk and Donor Milk  LATCH Score                    Lactation Tools Discussed/Used Tools: 58F feeding tube / Syringe;Nipple Shields Nipple shield size: 20 Breast pump type: Double-Electric Breast Pump Reason for Pumping: supplementation Pumping frequency: every 3 hours Pumped volume: 90 mL  Interventions    Discharge Discharge Education: Engorgement and breast care;Warning signs for feeding baby;Outpatient recommendation;Outpatient Epic message sent Pump: Personal;Manual  Consult Status Consult Status: Complete    Michel Bickers 08/31/2020, 1:25  PM

## 2020-09-11 ENCOUNTER — Ambulatory Visit (INDEPENDENT_AMBULATORY_CARE_PROVIDER_SITE_OTHER): Payer: BC Managed Care – PPO | Admitting: Psychology

## 2020-09-11 DIAGNOSIS — F411 Generalized anxiety disorder: Secondary | ICD-10-CM | POA: Diagnosis not present

## 2020-09-26 ENCOUNTER — Ambulatory Visit (INDEPENDENT_AMBULATORY_CARE_PROVIDER_SITE_OTHER): Payer: BC Managed Care – PPO | Admitting: Psychology

## 2020-09-26 DIAGNOSIS — F411 Generalized anxiety disorder: Secondary | ICD-10-CM | POA: Diagnosis not present

## 2020-10-02 ENCOUNTER — Ambulatory Visit (INDEPENDENT_AMBULATORY_CARE_PROVIDER_SITE_OTHER): Payer: BC Managed Care – PPO | Admitting: Psychology

## 2020-10-02 DIAGNOSIS — F411 Generalized anxiety disorder: Secondary | ICD-10-CM | POA: Diagnosis not present

## 2020-10-18 ENCOUNTER — Ambulatory Visit (INDEPENDENT_AMBULATORY_CARE_PROVIDER_SITE_OTHER): Payer: BC Managed Care – PPO | Admitting: Psychology

## 2020-10-18 DIAGNOSIS — F411 Generalized anxiety disorder: Secondary | ICD-10-CM | POA: Diagnosis not present

## 2020-10-24 ENCOUNTER — Ambulatory Visit (INDEPENDENT_AMBULATORY_CARE_PROVIDER_SITE_OTHER): Payer: BC Managed Care – PPO | Admitting: Psychology

## 2020-10-24 DIAGNOSIS — F411 Generalized anxiety disorder: Secondary | ICD-10-CM

## 2020-10-31 ENCOUNTER — Ambulatory Visit (INDEPENDENT_AMBULATORY_CARE_PROVIDER_SITE_OTHER): Payer: BC Managed Care – PPO | Admitting: Psychology

## 2020-10-31 DIAGNOSIS — F411 Generalized anxiety disorder: Secondary | ICD-10-CM

## 2020-11-07 ENCOUNTER — Ambulatory Visit (INDEPENDENT_AMBULATORY_CARE_PROVIDER_SITE_OTHER): Payer: BC Managed Care – PPO | Admitting: Psychology

## 2020-11-07 DIAGNOSIS — F411 Generalized anxiety disorder: Secondary | ICD-10-CM | POA: Diagnosis not present

## 2020-11-13 ENCOUNTER — Ambulatory Visit (INDEPENDENT_AMBULATORY_CARE_PROVIDER_SITE_OTHER): Payer: BC Managed Care – PPO | Admitting: Psychology

## 2020-11-13 DIAGNOSIS — F411 Generalized anxiety disorder: Secondary | ICD-10-CM

## 2020-11-20 ENCOUNTER — Ambulatory Visit (INDEPENDENT_AMBULATORY_CARE_PROVIDER_SITE_OTHER): Payer: BC Managed Care – PPO | Admitting: Psychology

## 2020-11-20 DIAGNOSIS — F411 Generalized anxiety disorder: Secondary | ICD-10-CM | POA: Diagnosis not present

## 2020-11-21 ENCOUNTER — Ambulatory Visit: Payer: BC Managed Care – PPO | Admitting: Psychology

## 2020-11-28 ENCOUNTER — Ambulatory Visit (INDEPENDENT_AMBULATORY_CARE_PROVIDER_SITE_OTHER): Payer: BC Managed Care – PPO | Admitting: Psychology

## 2020-11-28 DIAGNOSIS — F411 Generalized anxiety disorder: Secondary | ICD-10-CM | POA: Diagnosis not present

## 2020-12-05 ENCOUNTER — Ambulatory Visit (INDEPENDENT_AMBULATORY_CARE_PROVIDER_SITE_OTHER): Payer: BC Managed Care – PPO | Admitting: Psychology

## 2020-12-05 DIAGNOSIS — F411 Generalized anxiety disorder: Secondary | ICD-10-CM

## 2021-01-01 ENCOUNTER — Ambulatory Visit (INDEPENDENT_AMBULATORY_CARE_PROVIDER_SITE_OTHER): Payer: BC Managed Care – PPO | Admitting: Psychology

## 2021-01-01 DIAGNOSIS — F411 Generalized anxiety disorder: Secondary | ICD-10-CM

## 2021-01-16 ENCOUNTER — Ambulatory Visit (INDEPENDENT_AMBULATORY_CARE_PROVIDER_SITE_OTHER): Payer: BC Managed Care – PPO | Admitting: Psychology

## 2021-01-16 DIAGNOSIS — F411 Generalized anxiety disorder: Secondary | ICD-10-CM | POA: Diagnosis not present

## 2021-06-27 ENCOUNTER — Ambulatory Visit: Payer: BC Managed Care – PPO | Admitting: Podiatrist

## 2022-05-29 IMAGING — US US MFM OB DETAIL+14 WK
1 series · 12 of 28 positions shown · non-contrast
Comparison: none

[Series 1: us mfm ob detail+14 wk · 130 acquisitions, 12 frames shown]
[im 5/130]
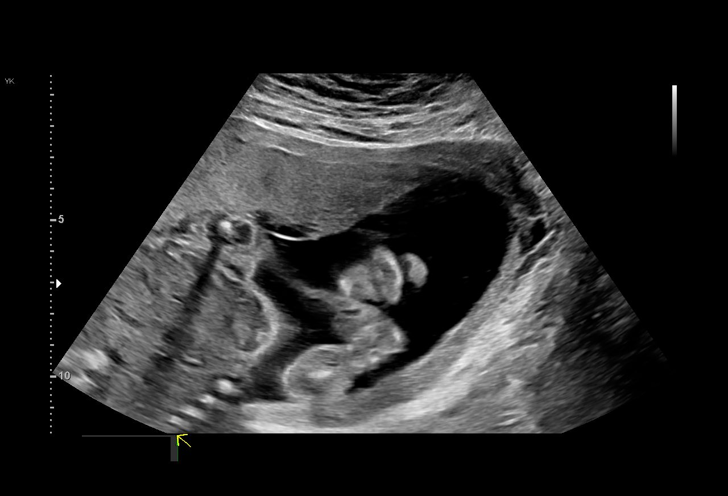
[im 15/130]
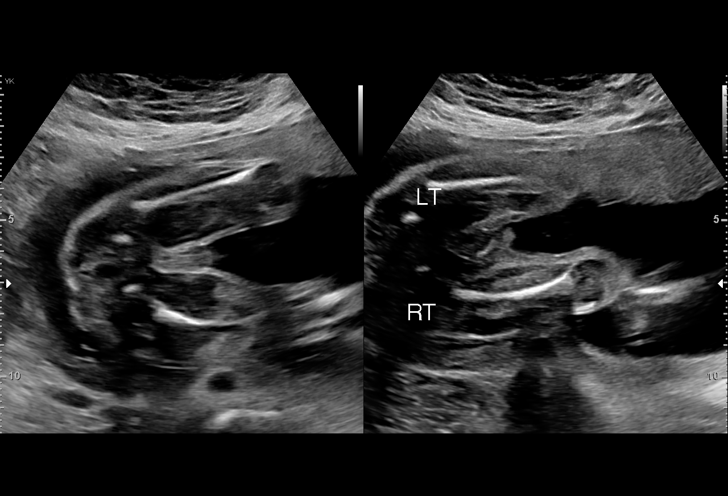
[im 24/130]
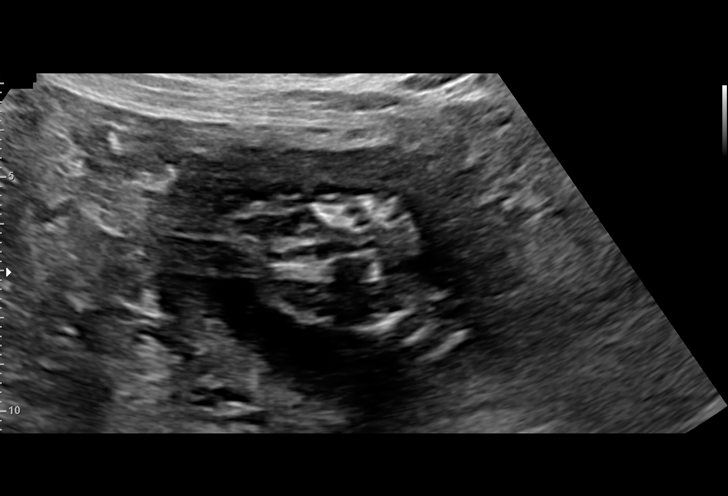
[im 39/130]
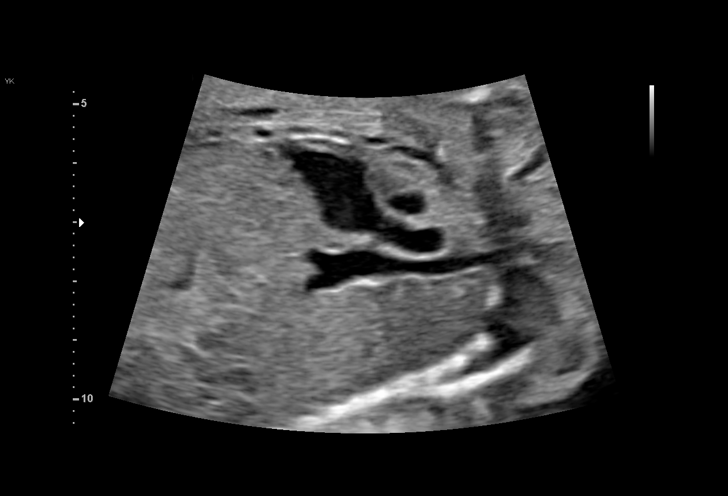
[im 48/130]
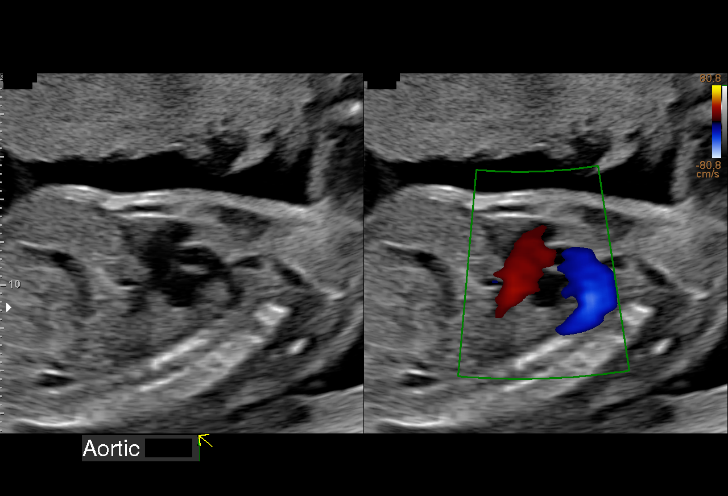
[im 58/130]
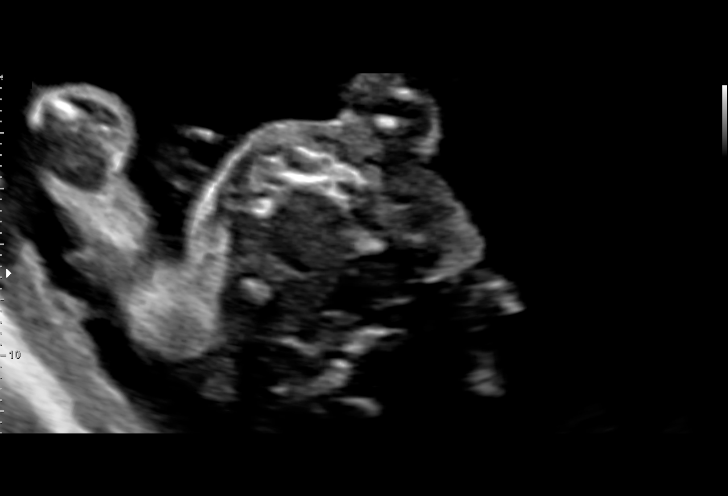
[im 72/130]
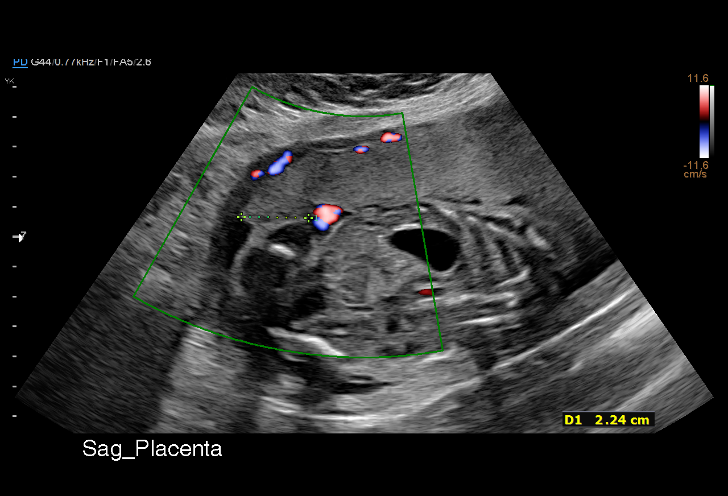
[im 82/130]
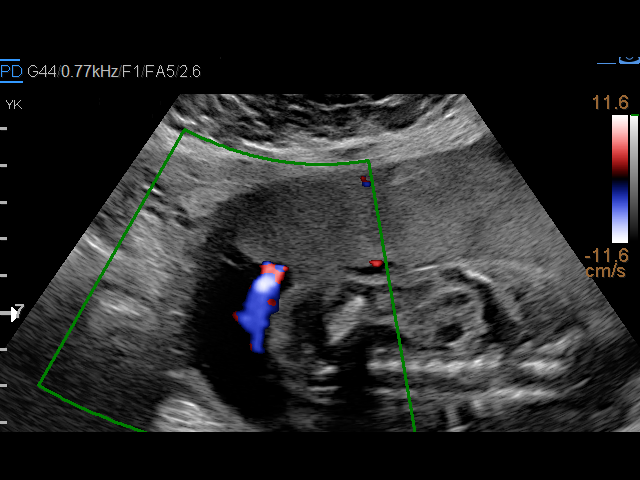
[im 91/130]
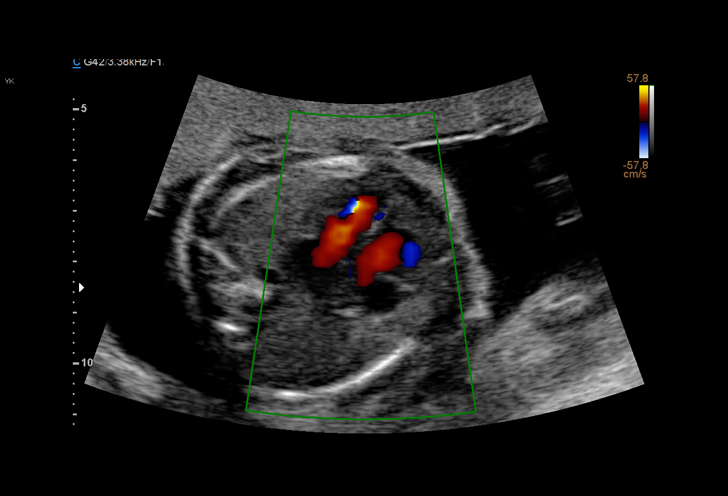
[im 106/130]
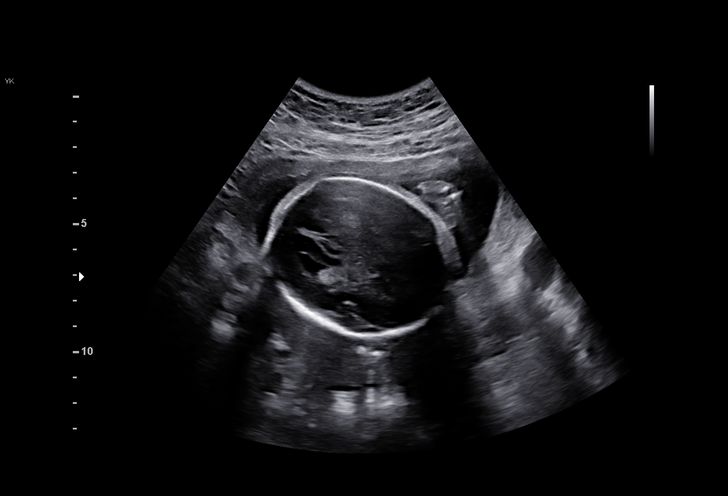
[im 115/130]
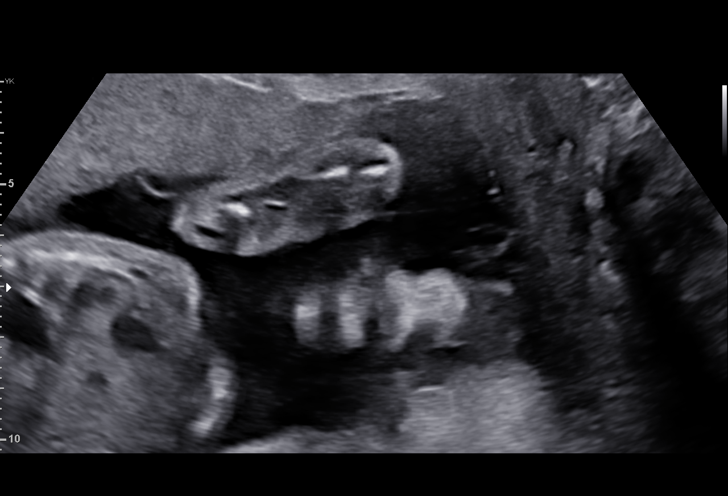
[im 125/130]
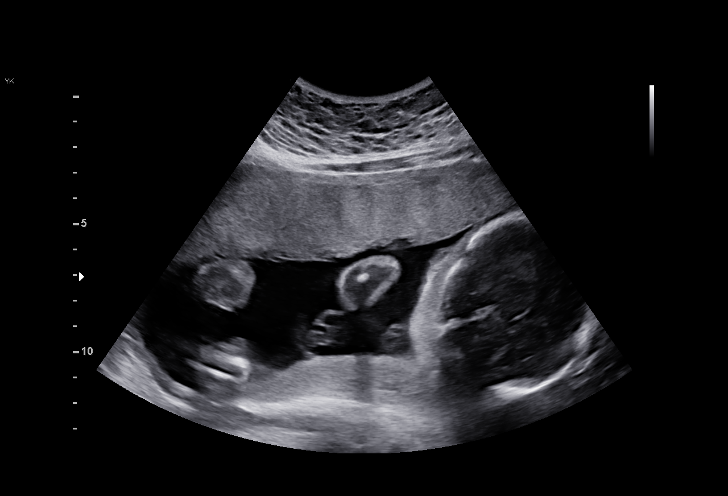

[12 of 28 positions shown; findings below may reference images not displayed]

Obstetrics &
                                                            Gynecology
                                                            [address]
                                                            [place].
                   CNM

Indications

 Marginal insertion (or velamentous) of
 umbilical cord affecting management of
 mother in second trimester
 Obesity complicating pregnancy, second
 trimester
 NIPS: Low risk,  FF;4 %  No gender desired,
 Horizon: Neg
 Encounter for antenatal screening for
 malformations
 22 weeks gestation of pregnancy
Fetal Evaluation

 Num Of Fetuses:         1
 Fetal Heart Rate(bpm):  155
 Cardiac Activity:       Observed
 Presentation:           Cephalic
 Placenta:               Anterior
 P. Cord Insertion:      Marginal insertion

 Amniotic Fluid
 AFI FV:      Within normal limits

                             Largest Pocket(cm)

Biometry

 BPD:        60  mm     G. Age:  24w 3d         97  %    CI:        71.26   %    70 - 86
                                                         FL/HC:      17.4   %    18.4 -
 HC:      226.4  mm     G. Age:  24w 5d         98  %    HC/AC:      1.14        1.06 -
 AC:      198.4  mm     G. Age:  24w 3d         94  %    FL/BPD:     65.7   %    71 - 87
 FL:       39.4  mm     G. Age:  22w 5d         48  %    FL/AC:      19.9   %    20 - 24

 Est. FW:     634  gm      1 lb 6 oz     97  %
OB History

 Gravidity:    2
 TOP:          1        Living:  0
Gestational Age

 LMP:           22w 3d        Date:  11/25/19                 EDD:   08/31/20
 U/S Today:     24w 1d                                        EDD:   08/19/20
 Best:          22w 3d     Det. By:  LMP  (11/25/19)          EDD:   08/31/20
Anatomy

 Cranium:               Appears normal         LVOT:                   Appears normal
 Cavum:                 Appears normal         Aortic Arch:            Appears normal
 Ventricles:            Appears normal         Ductal Arch:            Appears normal
 Choroid Plexus:        Appears normal         Diaphragm:              Appears normal
 Cerebellum:            Appears normal         Stomach:                Appears normal, left
                                                                       sided
 Posterior Fossa:       Appears normal         Abdomen:                Appears normal
 Nuchal Fold:           Not applicable (>20    Abdominal Wall:         Appears nml (cord
                        wks GA)                                        insert, abd wall)
 Face:                  Appears normal         Cord Vessels:           Appears normal (3
                        (orbits and profile)                           vessel cord)
 Lips:                  Appears normal         Kidneys:                Rt Nml, Lt Pelvis: 6
                                                                       mm
 Palate:                Appears normal         Bladder:                Appears normal
 Thoracic:              Appears normal         Spine:                  Appears normal
 Heart:                 Appears normal         Upper Extremities:      Appears normal
                        (4CH, axis, and
                        situs)
 RVOT:                  Appears normal         Lower Extremities:      Appears normal

 Other:  Parents do not wish to know sex of fetus. Lenses visualized. Hands
         and feet visualized. Heels visualized. Nasal bone visualized. VC, 3VV
         and 3VTV visualized.
Cervix Uterus Adnexa

 Cervix
 Length:           4.35  cm.
 Normal appearance by transabdominal scan.

 Uterus
 No abnormality visualized.
 Cul De Sac
 No free fluid seen.
Comments

 Matan Cajuste was seen for a detailed fetal anatomy scan
 and consultation due to a possible velamentous cord
 insertion versus a marginal placental cord insertion noted on
 a recent exam in your office.  She denies any problems in her
 current pregnancy and denies any significant past medical
 history.
 She had a cell free DNA test earlier in her pregnancy which
 indicated a low risk for trisomy 21, 18, and 13.  She did not
 want the fetal gender revealed.
 She was informed that the fetal growth and amniotic fluid
 level were appropriate for her gestational age.
 There were no obvious fetal anomalies noted on today's
 ultrasound exam.
 The patient was informed that anomalies may be missed due
 to technical limitations. If the fetus is in a suboptimal position
 or maternal habitus is increased, visualization of the fetus in
 the maternal uterus may be impaired.
 On today's exam, a marginal placental cord insertion was
 noted.  The umbilical cord appears to be inserted into the
 lateral edge of the anterior placenta near the fundus of her
 uterus.  The umbilical cord measures about 2 cm away from
 the edge of the placenta.
 She was advised that a marginal placental cord insertion is
 most likely a normal variant.  The small association of a
 marginal cord insertion with fetal growth issues later in her
 pregnancy was discussed.  Due to this indication, we will
 continue to follow her with monthly growth ultrasounds
 throughout her pregnancy.  The patient was reassured that
 the fetal growth actually measured large for her gestational
 age today.  There were no signs of a vasa previa noted today.
 A follow-up exam was scheduled in 5 weeks to assess the
 fetal growth.
 Total time spent in consultation: 35 minutes.

## 2022-07-03 IMAGING — US US MFM OB FOLLOW-UP
1 series · 13 of 28 positions shown · non-contrast
Comparison: none

[Series 1: us mfm ob follow-up · 13 of 63 slices shown]
[im 3/63]
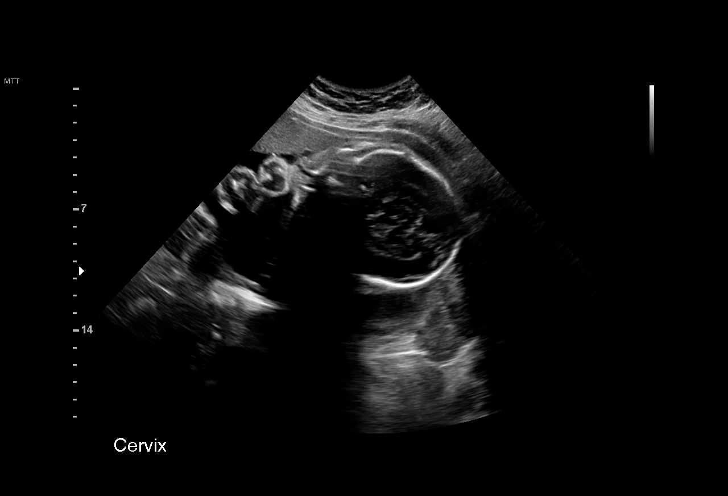
[im 7/63]
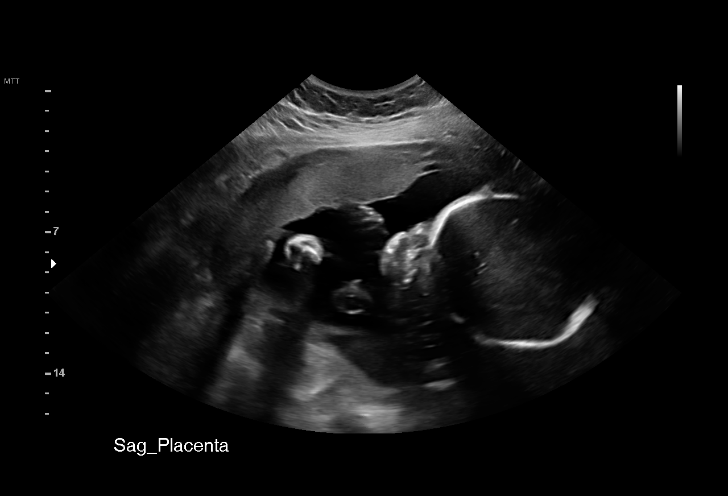
[im 12/63]
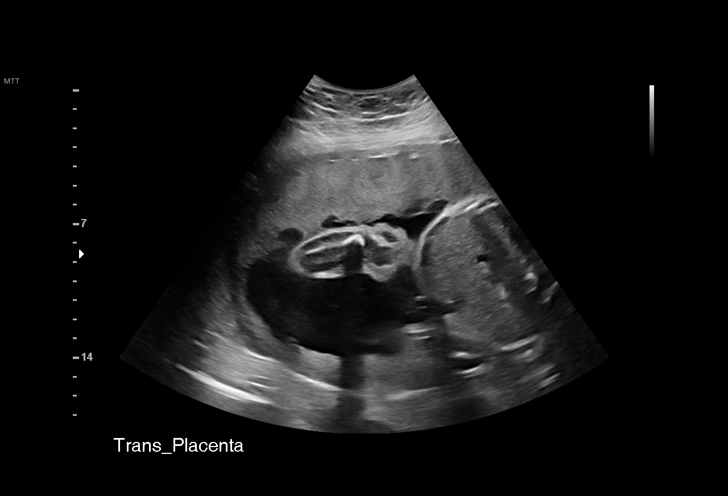
[im 17/63]
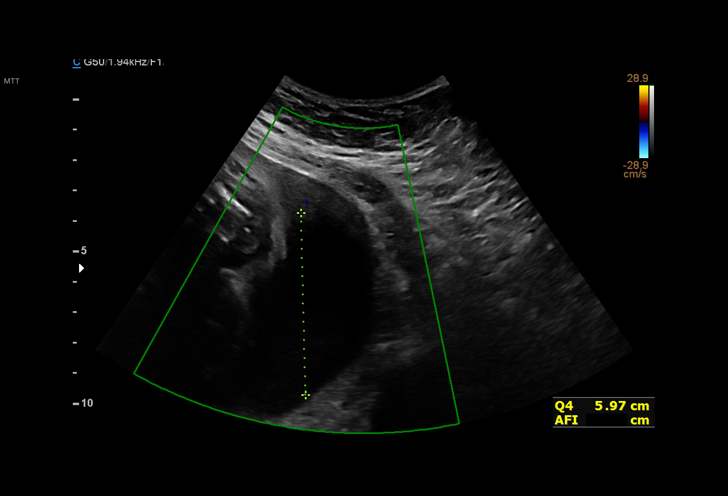
[im 21/63]
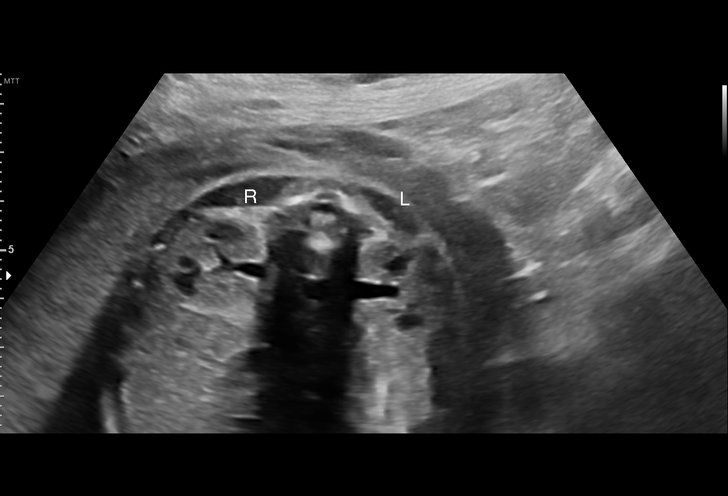
[im 26/63]
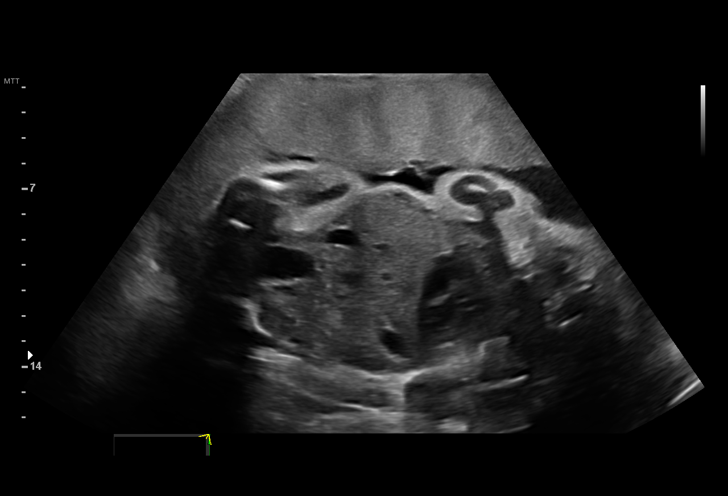
[im 33/63]
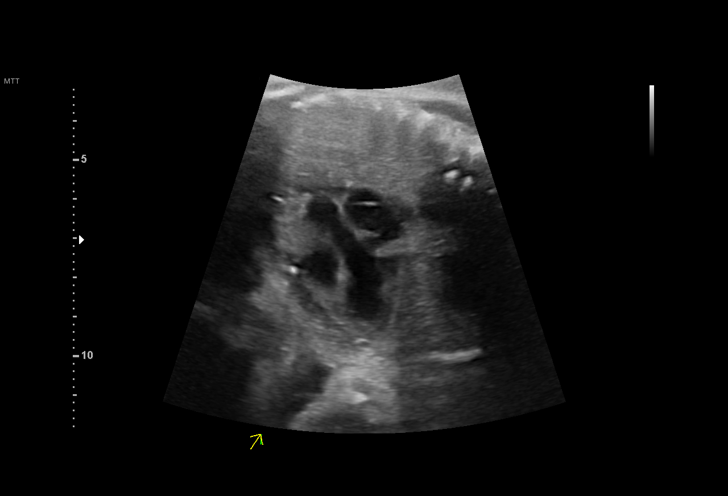
[im 37/63]
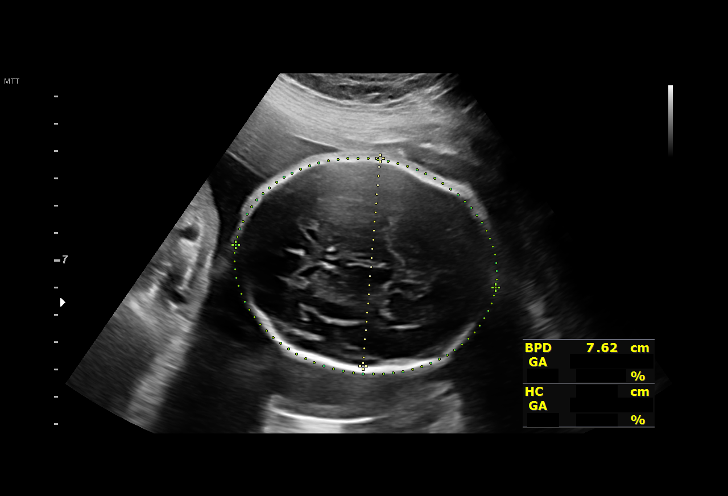
[im 42/63]
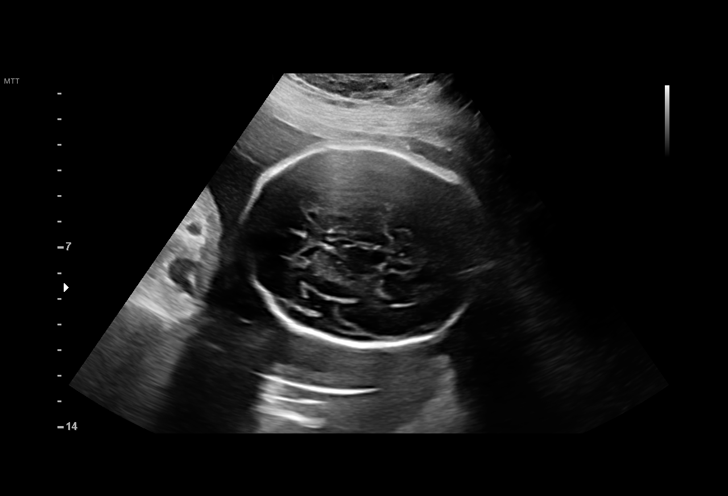
[im 46/63]
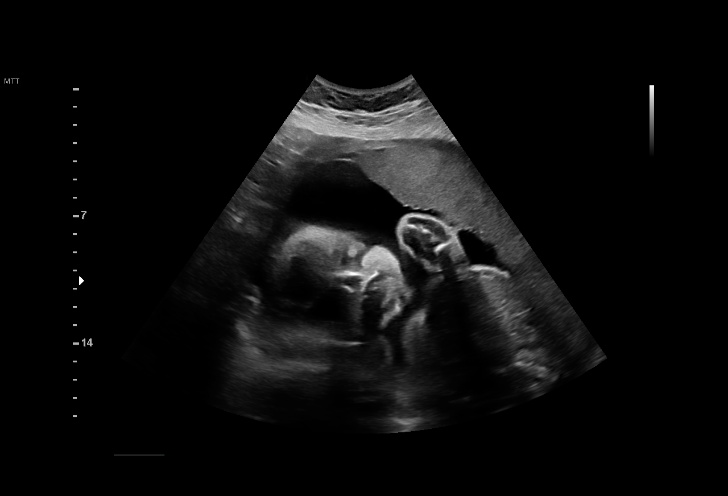
[im 51/63]
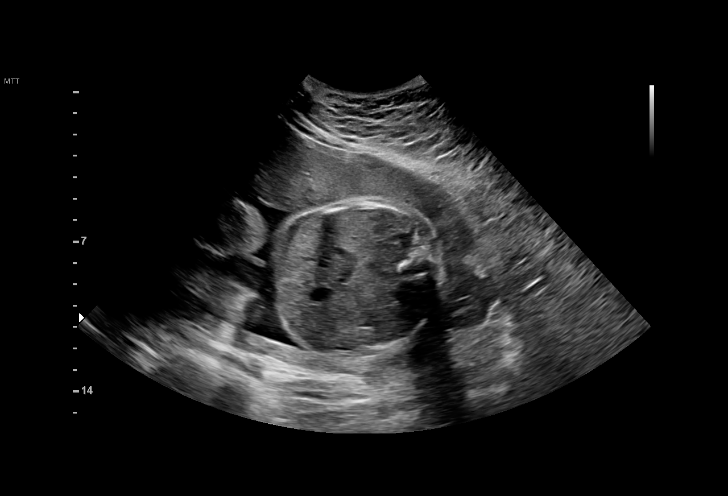
[im 56/63]
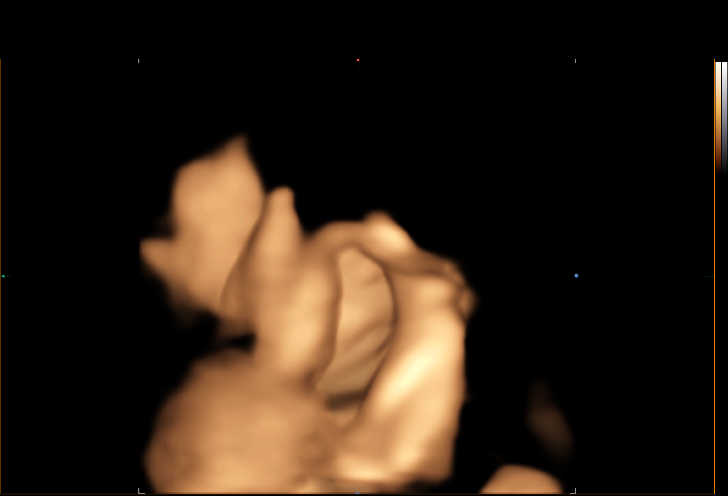
[im 60/63]
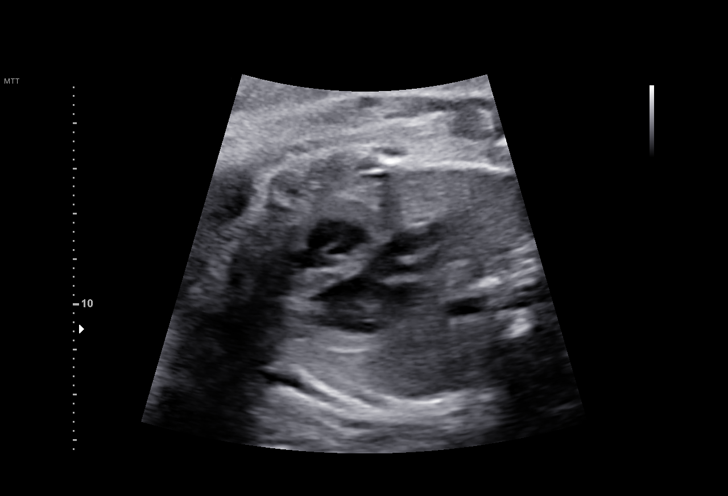

[13 of 28 positions shown; findings below may reference images not displayed]

Obstetrics &
                                                            Gynecology
                                                            [address]
                                                            [place].
                   CNM

Indications

 Marginal insertion (or velamentous) of
 umbilical cord affecting management of
 mother in second trimester
 27 weeks gestation of pregnancy
 Obesity complicating pregnancy, second
 trimester (BMI 35)
 NIPS: Low risk,  FF;4 %  No gender desired,
 Horizon: Neg
 Encounter for other antenatal screening
 follow-up
Fetal Evaluation

 Num Of Fetuses:         1
 Fetal Heart Rate(bpm):  137
 Cardiac Activity:       Observed
 Presentation:           Cephalic
 Placenta:               Anterior
 P. Cord Insertion:      Marginal insertion

 Amniotic Fluid
 AFI FV:      Within normal limits

 AFI Sum(cm)     %Tile       Largest Pocket(cm)
 17.6            67          6
 RUQ(cm)       RLQ(cm)       LUQ(cm)        LLQ(cm)
 4.6           6
Biometry

 BPD:      76.1  mm     G. Age:  30w 4d       > 99  %    CI:        77.47   %    70 - 86
                                                         FL/HC:      19.4   %    18.6 -
 HC:      273.7  mm     G. Age:  29w 6d         91  %    HC/AC:      1.06        1.05 -
 AC:      257.9  mm     G. Age:  30w 0d         97  %    FL/BPD:     69.6   %    71 - 87
 FL:         53  mm     G. Age:  28w 1d         57  %    FL/AC:      20.6   %    20 - 24
 CER:        36  mm     G. Age:  30w 0d     > 97.7  %
 LV:        8.1  mm
 CM:        6.6  mm

 Est. FW:    6461  gm      3 lb 1 oz     97  %
OB History

 Gravidity:    2
 TOP:          1        Living:  0
Gestational Age

 LMP:           27w 3d        Date:  11/25/19                 EDD:   08/31/20
 U/S Today:     29w 5d                                        EDD:   08/15/20
 Best:          27w 3d     Det. By:  LMP  (11/25/19)          EDD:   08/31/20
Anatomy

 Cranium:               Appears normal         LVOT:                   Appears normal
 Cavum:                 Appears normal         Aortic Arch:            Appears normal
 Ventricles:            Appears normal         Ductal Arch:            Previously seen
 Choroid Plexus:        Previously seen        Diaphragm:              Appears normal
 Cerebellum:            Appears normal         Stomach:                Appears normal, left
                                                                       sided
 Posterior Fossa:       Appears normal         Abdomen:                Previously seen
 Nuchal Fold:           Not applicable (>20    Abdominal Wall:         Previously seen
                        wks GA)
 Face:                  Orbits and profile     Cord Vessels:           Previously seen
                        previously seen
 Lips:                  Previously seen        Kidneys:                Appear normal
 Palate:                Previously seen        Bladder:                Appears normal
 Thoracic:              Appears normal         Spine:                  Previously seen
 Heart:                 Appears normal         Upper Extremities:      Previously seen
                        (4CH, axis, and
                        situs)
 RVOT:                  Appears normal         Lower Extremities:      Previously seen

 Other:  Parents do not wish to know sex of fetus. Lenses, Hands, feet/Heels,
         Nasal bone, VC, 3VV and 3VTV visualized previously. Technically
         difficult due to advanced GA and fetal position.
Cervix Uterus Adnexa

 Cervix
 Length:           3.78  cm.
 Normal appearance by transabdominal scan.
 Uterus
 No abnormality visualized.

 Right Ovary
 Within normal limits.

 Left Ovary
 Within normal limits.

 Cul De Sac
 No free fluid seen.

 Adnexa
 No abnormality visualized.
Comments

 This patient was seen for a follow up growth scan due to due
 to a marginal placental cord insertion noted during her prior
 ultrasound exam.  She denies any problems since her last
 exam.
 She was informed that the fetal growth measures large for
 her gestational [AGE]th percentile).  There was normal
 amniotic fluid noted.
 The patient reports that she will be screened for gestational
 diabetes later this week.
 A follow up scan was scheduled in 4 weeks.

## 2022-08-07 IMAGING — US US MFM OB FOLLOW-UP
1 series · 14 of 28 positions shown · non-contrast
Comparison: none

[Series 1: us mfm ob follow-up · 39 acquisitions, 14 frames shown]
[im 2/39]
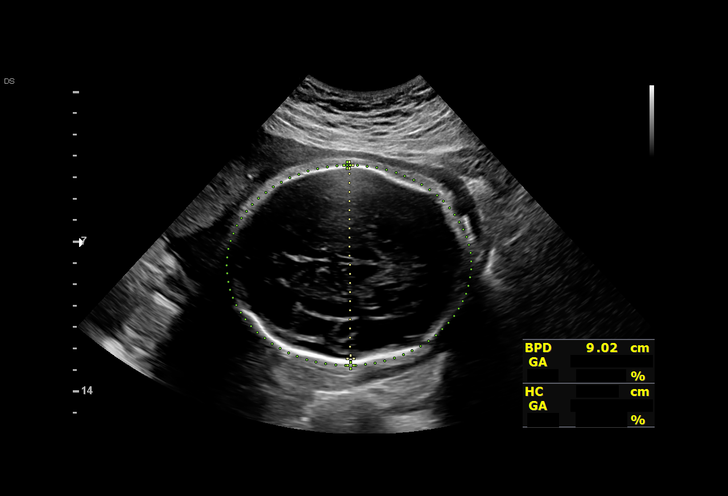
[im 5/39]
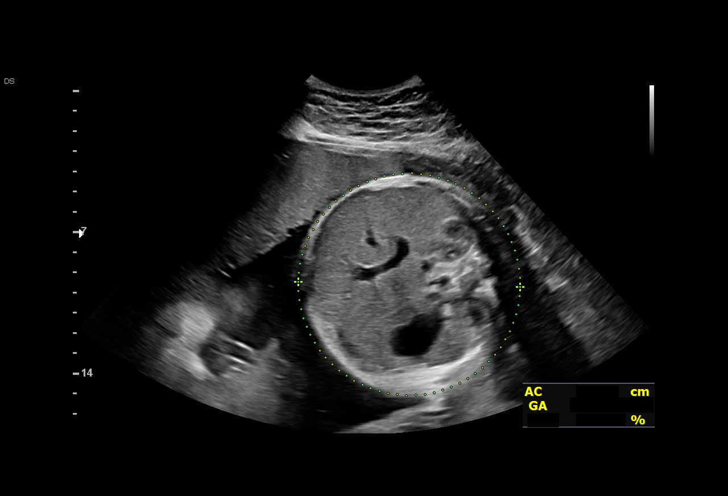
[im 8/39]
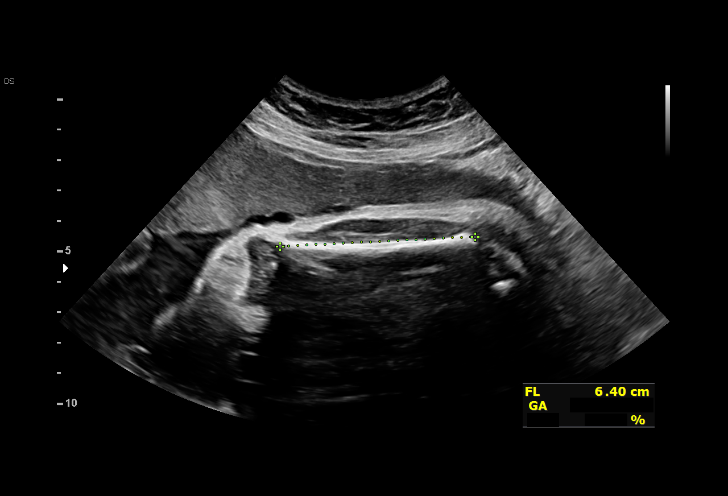
[im 10/39]
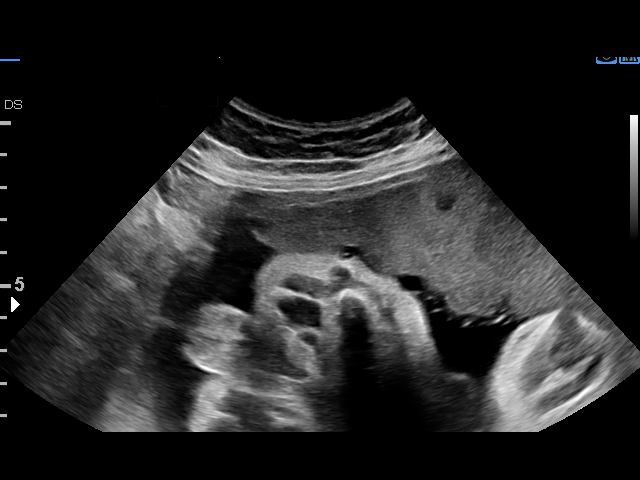
[im 13/39]
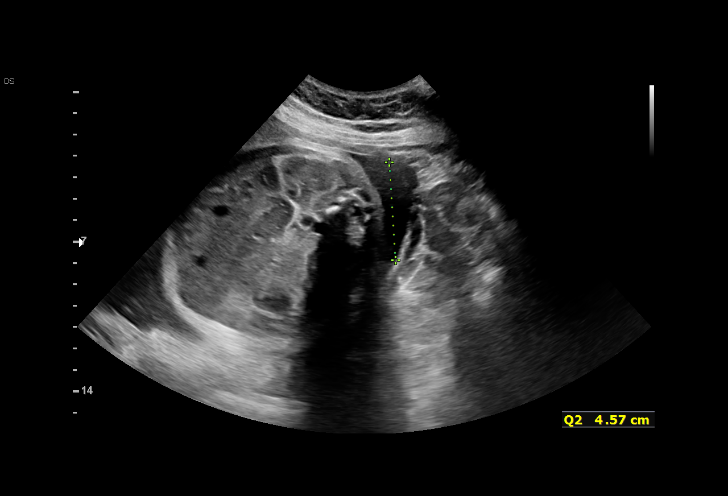
[im 16/39]
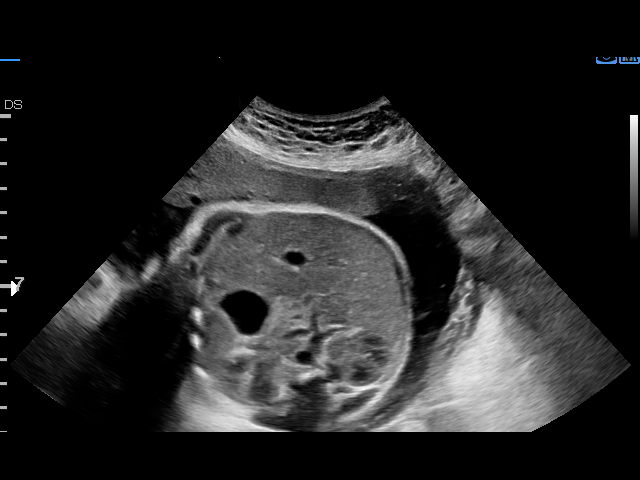
[im 19/39]
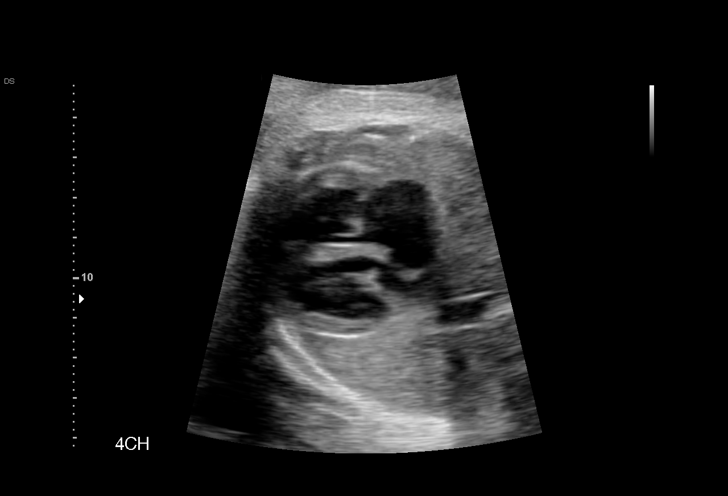
[im 22/39]
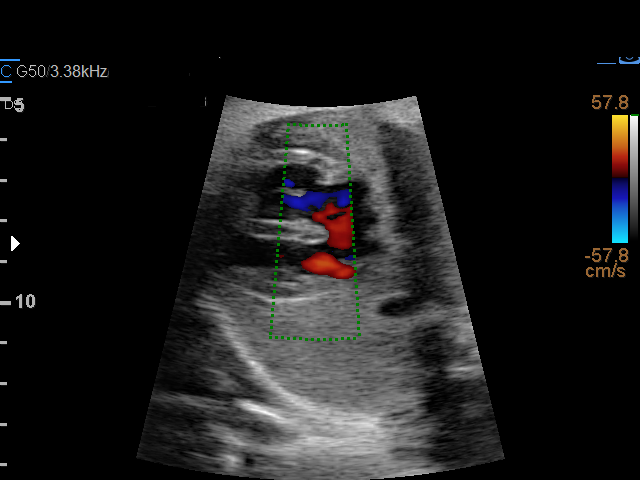
[im 24/39]
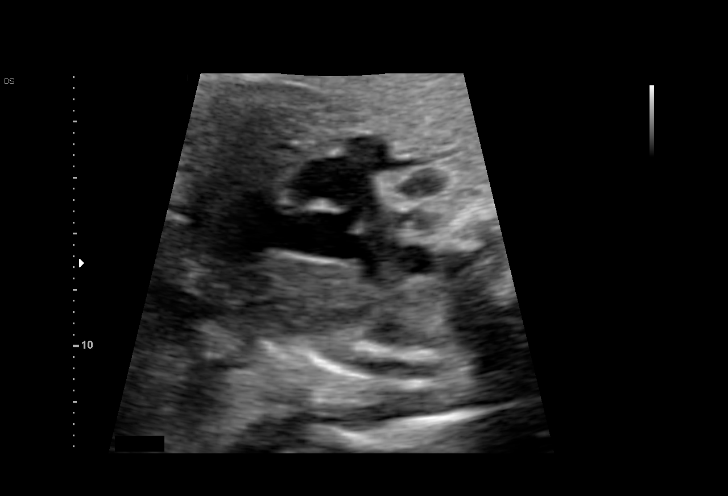
[im 27/39]
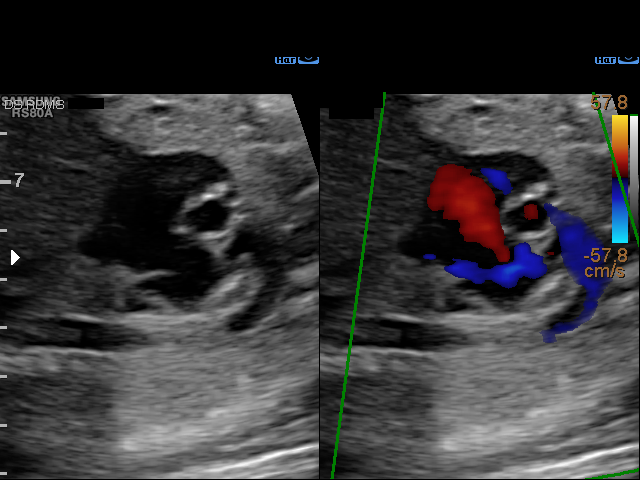
[im 30/39]
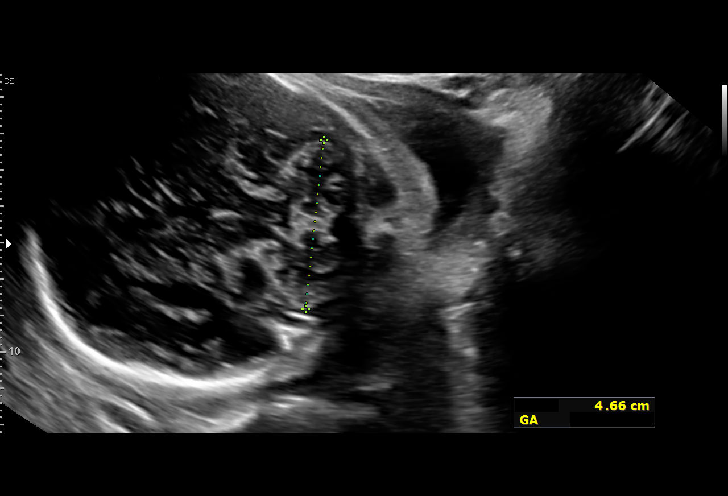
[im 33/39]
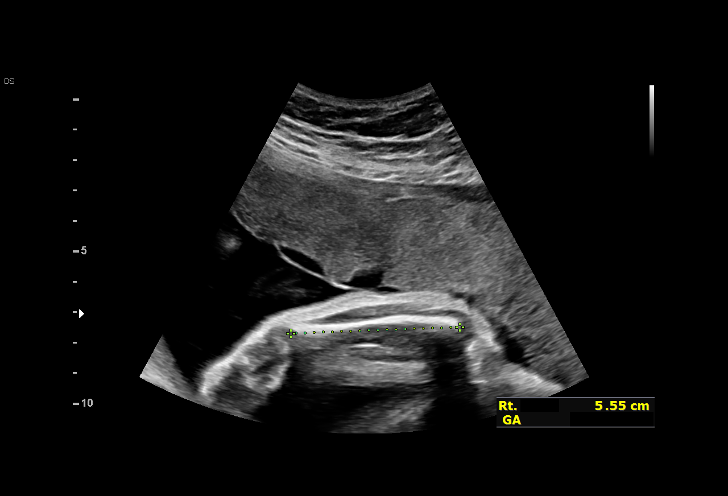
[im 36/39]
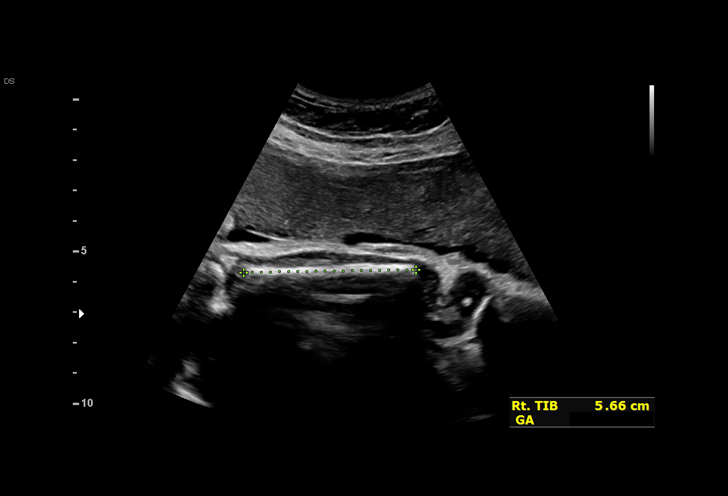
[im 39/39]
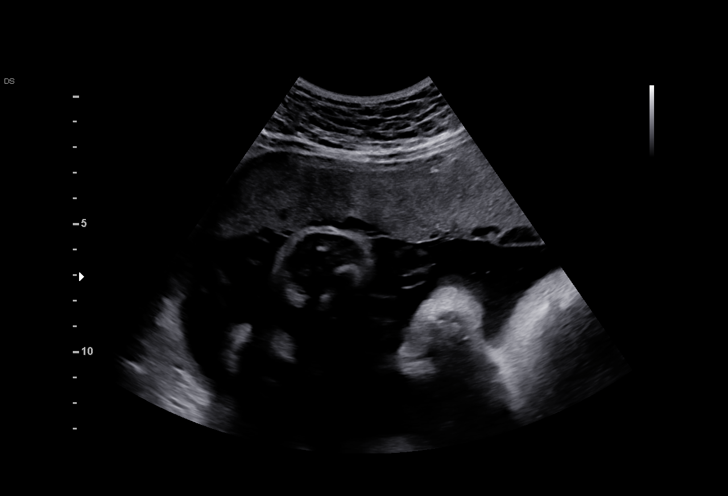

[14 of 28 positions shown; findings below may reference images not displayed]

Obstetrics &
                                                            Gynecology
                                                            [id number]
                                                            [place].
                   CNM

Indications

 Marginal insertion of umbilical cord affecting
 management of mother in third trimester
 Obesity complicating pregnancy, third
 trimester
 Encounter for other antenatal screening
 follow-up
 32 weeks gestation of pregnancy
 NIPS: Low risk,  FF;4 %  No gender desired,
 Horizon: Neg
Fetal Evaluation

 Num Of Fetuses:         1
 Cardiac Activity:       Observed
 Presentation:           Cephalic
 Placenta:               Anterior
 P. Cord Insertion:      Marginal insertion

 Amniotic Fluid
 AFI FV:      Within normal limits

 AFI Sum(cm)     %Tile       Largest Pocket(cm)
 19.58           74
 RUQ(cm)       RLQ(cm)       LUQ(cm)        LLQ(cm)

Biometry

 BPD:      90.3  mm     G. Age:  36w 4d       > 99  %    CI:        78.18   %    70 - 86
                                                         FL/HC:      19.9   %    19.1 -
 HC:      323.1  mm     G. Age:  36w 4d         97  %    HC/AC:      0.95        0.96 -
 AC:      339.6  mm     G. Age:  37w 6d       > 99  %    FL/BPD:     71.1   %    71 - 87
 FL:       64.2  mm     G. Age:  33w 1d         58  %    FL/AC:      18.9   %    20 - 24
 HUM:      56.4  mm     G. Age:  32w 6d         61  %
 CER:        48  mm     G. Age:  36w 1d     > 97.7  %
 LV:        5.8  mm
 ULN:      55.5  mm     G. Age:  34w 6d         85  %
 TIB:      56.6  mm     G. Age:  33w 1d         72  %
 RAD:      49.8  mm     G. Age:  38w 6d         85  %
 FIB:      56.6  mm     G. Age:  34w 6d         91  %

 Est. FW:    5147  gm      6 lb 8 oz   > 99  %
OB History

 Gravidity:    2
 TOP:          1        Living:  0
Gestational Age

 LMP:           32w 3d        Date:  11/25/19                 EDD:   08/31/20
 U/S Today:     36w 0d                                        EDD:   08/06/20
 Best:          32w 3d     Det. By:  LMP  (11/25/19)          EDD:   08/31/20
Anatomy

 Cranium:               Appears normal         Heart:                  Appears normal
                                                                       (4CH, axis, and
                                                                       situs)
 Cavum:                 Appears normal         RVOT:                   Appears normal
 Ventricles:            Appears normal         LVOT:                   Appears normal
 Choroid Plexus:        Appears normal         Aortic Arch:            Appears normal
 Cerebellum:            Appears normal         Ductal Arch:            Appears normal
 Posterior Fossa:       Appears normal         Stomach:                Appears normal, left
                                                                       sided
 Face:                  Appears normal         Kidneys:                Appear normal
                        (orbits and profile)
 Lips:                  Appears normal         Bladder:                Appears normal

 Other:  Other anatomy previously imaged and appeared normal.
Comments

 This patient was seen for a follow up exam as the fetal size
 has been measuring large for her gestational age since
 earlier in her pregnancy.  She reports that she has screened
 negative for gestational diabetes in her current pregnancy.
 Her EDC August 31, 2020 was confirmed using a first
 trimester ultrasound performed in your office.
 She was informed that the fetal growth continues to measure
 large for her gestational age (greater than 99th percentile).
 There was normal amniotic fluid noted.
 A follow up exam was scheduled in 5 weeks to assess the
 fetal growth closer to the time of delivery.  Should the EFW at
 her next exam be close to 1111 g, the patient understands
 that a cesarean delivery may be recommended.

## 2024-08-22 ENCOUNTER — Ambulatory Visit: Admitting: Physician Assistant
